# Patient Record
Sex: Male | Born: 1945 | Race: White | Hispanic: No | Smoking: Former smoker
Health system: Southern US, Community
[De-identification: ages and names within clinical notes are randomized; demographics above are authoritative.]

## PROBLEM LIST (undated history)

## (undated) DIAGNOSIS — M1990 Unspecified osteoarthritis, unspecified site: Secondary | ICD-10-CM

## (undated) DIAGNOSIS — I10 Essential (primary) hypertension: Secondary | ICD-10-CM

## (undated) DIAGNOSIS — Z85828 Personal history of other malignant neoplasm of skin: Secondary | ICD-10-CM

## (undated) DIAGNOSIS — Z77098 Contact with and (suspected) exposure to other hazardous, chiefly nonmedicinal, chemicals: Secondary | ICD-10-CM

## (undated) DIAGNOSIS — E782 Mixed hyperlipidemia: Secondary | ICD-10-CM

## (undated) DIAGNOSIS — Z86010 Personal history of colonic polyps: Secondary | ICD-10-CM

## (undated) DIAGNOSIS — H9319 Tinnitus, unspecified ear: Secondary | ICD-10-CM

## (undated) DIAGNOSIS — Z9889 Other specified postprocedural states: Secondary | ICD-10-CM

## (undated) DIAGNOSIS — K219 Gastro-esophageal reflux disease without esophagitis: Secondary | ICD-10-CM

## (undated) DIAGNOSIS — H9190 Unspecified hearing loss, unspecified ear: Secondary | ICD-10-CM

## (undated) DIAGNOSIS — R011 Cardiac murmur, unspecified: Secondary | ICD-10-CM

## (undated) DIAGNOSIS — E119 Type 2 diabetes mellitus without complications: Secondary | ICD-10-CM

## (undated) DIAGNOSIS — R351 Nocturia: Secondary | ICD-10-CM

## (undated) DIAGNOSIS — Z7739 Contact with and (suspected) exposure to other war theater: Secondary | ICD-10-CM

## (undated) DIAGNOSIS — C801 Malignant (primary) neoplasm, unspecified: Secondary | ICD-10-CM

## (undated) HISTORY — DX: Gastro-esophageal reflux disease without esophagitis: K21.9

## (undated) HISTORY — DX: Personal history of colonic polyps: Z86.010

## (undated) HISTORY — DX: Contact with and (suspected) exposure to other war theater: Z77.39

## (undated) HISTORY — PX: BILATERAL KNEE ARTHROSCOPY: SUR91

## (undated) HISTORY — PX: EYE SURGERY: SHX253

## (undated) HISTORY — DX: Essential (primary) hypertension: I10

## (undated) HISTORY — DX: Cardiac murmur, unspecified: R01.1

## (undated) HISTORY — DX: Other specified postprocedural states: Z98.890

## (undated) HISTORY — DX: Malignant (primary) neoplasm, unspecified: C80.1

## (undated) HISTORY — DX: Type 2 diabetes mellitus without complications: E11.9

## (undated) HISTORY — DX: Mixed hyperlipidemia: E78.2

## (undated) HISTORY — PX: OTHER SURGICAL HISTORY: SHX169

## (undated) HISTORY — DX: Contact with and (suspected) exposure to other hazardous, chiefly nonmedicinal, chemicals: Z77.098

## (undated) HISTORY — DX: Unspecified osteoarthritis, unspecified site: M19.90

---
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
## 1988-11-29 HISTORY — PX: ROTATOR CUFF REPAIR: SHX139

## 1998-12-15 ENCOUNTER — Ambulatory Visit (HOSPITAL_COMMUNITY): Admission: RE | Admit: 1998-12-15 | Discharge: 1998-12-15 | Payer: Self-pay | Admitting: Family Medicine

## 2001-02-21 ENCOUNTER — Ambulatory Visit (HOSPITAL_COMMUNITY): Admission: RE | Admit: 2001-02-21 | Discharge: 2001-02-21 | Payer: Self-pay | Admitting: *Deleted

## 2001-02-21 ENCOUNTER — Encounter: Payer: Self-pay | Admitting: *Deleted

## 2005-11-29 HISTORY — PX: TOTAL KNEE ARTHROPLASTY: SHX125

## 2005-12-10 ENCOUNTER — Ambulatory Visit: Payer: Self-pay | Admitting: Internal Medicine

## 2006-01-06 ENCOUNTER — Inpatient Hospital Stay (HOSPITAL_COMMUNITY): Admission: RE | Admit: 2006-01-06 | Discharge: 2006-01-10 | Payer: Self-pay | Admitting: Specialist

## 2009-03-20 ENCOUNTER — Ambulatory Visit: Payer: Self-pay | Admitting: Internal Medicine

## 2009-03-29 DIAGNOSIS — Z9889 Other specified postprocedural states: Secondary | ICD-10-CM

## 2009-03-29 HISTORY — DX: Other specified postprocedural states: Z98.890

## 2009-04-02 ENCOUNTER — Ambulatory Visit: Payer: Self-pay | Admitting: Internal Medicine

## 2009-04-02 ENCOUNTER — Encounter: Payer: Self-pay | Admitting: Internal Medicine

## 2009-04-02 DIAGNOSIS — Z860100 Personal history of colon polyps, unspecified: Secondary | ICD-10-CM

## 2009-04-02 HISTORY — PX: COLONOSCOPY: SHX174

## 2009-04-02 HISTORY — DX: Personal history of colon polyps, unspecified: Z86.0100

## 2009-04-02 HISTORY — DX: Personal history of colonic polyps: Z86.010

## 2009-04-15 ENCOUNTER — Encounter: Payer: Self-pay | Admitting: Internal Medicine

## 2011-01-15 ENCOUNTER — Encounter (HOSPITAL_COMMUNITY): Payer: Self-pay | Admitting: Radiology

## 2011-01-15 ENCOUNTER — Emergency Department (HOSPITAL_COMMUNITY)
Admission: EM | Admit: 2011-01-15 | Discharge: 2011-01-15 | Disposition: A | Discharge status: Home / Self Care | Payer: PRIVATE HEALTH INSURANCE | Attending: Emergency Medicine | Admitting: Emergency Medicine

## 2011-01-15 ENCOUNTER — Emergency Department (HOSPITAL_COMMUNITY): Payer: PRIVATE HEALTH INSURANCE

## 2011-01-15 DIAGNOSIS — R0789 Other chest pain: Secondary | ICD-10-CM | POA: Insufficient documentation

## 2011-01-15 DIAGNOSIS — I10 Essential (primary) hypertension: Secondary | ICD-10-CM | POA: Insufficient documentation

## 2011-01-15 DIAGNOSIS — E119 Type 2 diabetes mellitus without complications: Secondary | ICD-10-CM | POA: Insufficient documentation

## 2011-01-15 DIAGNOSIS — R0989 Other specified symptoms and signs involving the circulatory and respiratory systems: Secondary | ICD-10-CM | POA: Insufficient documentation

## 2011-01-15 DIAGNOSIS — R0609 Other forms of dyspnea: Secondary | ICD-10-CM | POA: Insufficient documentation

## 2011-01-15 DIAGNOSIS — E785 Hyperlipidemia, unspecified: Secondary | ICD-10-CM | POA: Insufficient documentation

## 2011-01-15 LAB — COMPREHENSIVE METABOLIC PANEL
ALT: 33 U/L (ref 0–53)
BUN: 17 mg/dL (ref 6–23)
CO2: 26 mEq/L (ref 19–32)
Calcium: 9.5 mg/dL (ref 8.4–10.5)
GFR calc non Af Amer: >60 mL/min (ref >60)
Glucose, Bld: 191 mg/dL — ABNORMAL HIGH (ref 70–99)
Sodium: 136 mEq/L (ref 135–145)

## 2011-01-15 LAB — CBC
HCT: 38 % — ABNORMAL LOW (ref 39.0–52.0)
Hemoglobin: 13 g/dL (ref 13.0–17.0)
MCH: 29 pg (ref 26.0–34.0)
MCHC: 34.2 g/dL (ref 30.0–36.0)
MCV: 84.6 fL (ref 78.0–100.0)
Platelets: 199 10*3/uL (ref 150–400)
RBC: 4.49 MIL/uL (ref 4.22–5.81)
RDW: 12.2 % (ref 11.5–15.5)
WBC: 4.9 10*3/uL (ref 4.0–10.5)

## 2011-01-15 LAB — CK TOTAL AND CKMB (NOT AT ARMC)
CK, MB: 1.4 ng/mL (ref 0.3–4.0)
Relative Index: INVALID (ref 0.0–2.5)
Total CK: 74 U/L (ref 7–232)

## 2011-01-15 LAB — LIPASE, BLOOD: Lipase: 27 U/L (ref 11–59)

## 2011-02-19 ENCOUNTER — Ambulatory Visit (INDEPENDENT_AMBULATORY_CARE_PROVIDER_SITE_OTHER): Payer: PRIVATE HEALTH INSURANCE | Admitting: Cardiology

## 2011-02-19 ENCOUNTER — Encounter: Payer: Self-pay | Admitting: Cardiology

## 2011-02-19 VITALS — BP 110/69 | HR 69 | Ht 69.0 in | Wt 222.0 lb

## 2011-02-19 DIAGNOSIS — R072 Precordial pain: Secondary | ICD-10-CM | POA: Insufficient documentation

## 2011-02-19 DIAGNOSIS — K219 Gastro-esophageal reflux disease without esophagitis: Secondary | ICD-10-CM

## 2011-02-19 DIAGNOSIS — I10 Essential (primary) hypertension: Secondary | ICD-10-CM

## 2011-02-19 DIAGNOSIS — R079 Chest pain, unspecified: Secondary | ICD-10-CM

## 2011-02-19 DIAGNOSIS — E119 Type 2 diabetes mellitus without complications: Secondary | ICD-10-CM

## 2011-02-19 DIAGNOSIS — E782 Mixed hyperlipidemia: Secondary | ICD-10-CM

## 2011-02-19 NOTE — Assessment & Plan Note (Signed)
Followed by Dr Fusco 

## 2011-02-19 NOTE — Progress Notes (Signed)
Clinical Summary Reginald Chambers is a 65 y.o.male presenting for cardiology consultation. He reports a history of intermittent chest discomfort, initially describing a "knot" sensation in his xiphoid area that occurred approximately 2 months ago, subsequently with an intermittent dull discomfort in his chest as well as in his left arm. He was treated with a proton pump inhibitor, that he thinks may have helped symptoms to some degree. He states that he is under a lot of stress with his job, has been trying to do some exercise by walking. He reports NYHA class II dyspnea on exertion, no palpitations or syncope.  Reginald Chambers states he underwent cardiac stress testing at least 5 years ago through our practice in [place], results presently not available for my review. He is not aware of any significant abnormalities at that time. He does have ongoing risk factors for coronary artery disease, outlined below, as well as some family history.   Allergies  Allergen Reactions  . Penicillins     Current outpatient prescriptions:aspirin 325 MG tablet, Take 325 mg by mouth daily.  , Disp: , Rfl: ;  celecoxib (CELEBREX) 200 MG capsule, Take 200 mg by mouth 2 (two) times daily.  , Disp: , Rfl: ;  glipiZIDE (GLUCOTROL) 5 MG tablet, Take 5 mg by mouth 2 (two) times daily before a meal.  , Disp: , Rfl: ;  metFORMIN (GLUMETZA) 1000 MG (MOD) 24 hr tablet, Take 1,000 mg by mouth 2 (two) times daily.  , Disp: , Rfl:  pantoprazole (PROTONIX) 40 MG tablet, Take 40 mg by mouth daily.  , Disp: , Rfl: ;  simvastatin (ZOCOR) 40 MG tablet, Take 40 mg by mouth at bedtime.  , Disp: , Rfl: ;  telmisartan-hydrochlorothiazide (MICARDIS HCT) 40-12.5 MG per tablet, Take 1 tablet by mouth daily.  , Disp: , Rfl:   Past Medical History  Diagnosis Date  . Type 2 diabetes mellitus   . Essential hypertension, benign   . Mixed hyperlipidemia   . Osteoarthritis   . Malaria     Past Surgical History  Procedure Date  . Total knee  arthroplasty 2007    Left knee  . Appendectomy 1959  . Rotator cuff repair 1990    Left shoulder    Family History  Problem Relation Age of Onset  . Lymphoma Father   . Heart failure Mother   . Diabetes type II Mother     Social History Reginald Chambers reports that he quit smoking today. His smoking use included Cigarettes. He has a 5 pack-year smoking history. He has never used smokeless tobacco. Reginald Chambers reports that he drinks alcohol.  Review of Systems No fevers, chills, or unusual weight change. No chest pain, progressive shortness of breath, cough, hemoptysis, or wheezing. No palpitations, dizziness, or syncope. No dysphasia or odynophagia. Stable appetite with no abdominal pain, melena, or hematochezia. No orthopnea, PND, or lower extremity edema. No focal motor weakness, memory problems, or speech deficits. Some arthritic discomfort, left shoulder and knee predominantly. Otherwise systems reviewed and negative except as already outlined.   Physical Examination Filed Vitals:   02/19/11 1326  BP: 110/69  Pulse: 69  Overweight, bearded male in no acute distress. HEENT: Conjunctiva and lids normal, oropharynx clear with moist mucosa. Neck: Supple, no elevated JVP or carotid bruits, no thyromegaly. Lungs: Clear to auscultation, nonlabored breathing at rest. Cardiac: Regular rate and rhythm, no S3 or significant systolic murmur, no pericardial rub. Abdomen: Soft, nontender, no hepatomegaly, bowel sounds present, no guarding or  rebound. Extremities: No pitting edema, distal pulses 2+. Skin: Warm and dry. Musculoskeletal: No kyphosis. Neuropsychiatric: Alert and oriented x3, affect grossly appropriate.   ECG Sinus rhythm at 69 beats per minute.   Problem List and Plan

## 2011-02-19 NOTE — Assessment & Plan Note (Signed)
Blood pressure is well-controlled today. 

## 2011-02-19 NOTE — Patient Instructions (Signed)
**Note De-Identified Ruger Saxer Obfuscation** We will contact you with results of test Your physician recommends that you schedule a follow-up appointment in: as needed Your physician has requested that you have en exercise stress myoview. For further information please visit [email or web address]. Please follow instruction sheet, as given.

## 2011-02-19 NOTE — Assessment & Plan Note (Signed)
Followed by Dr. Sherwood Gambler, on statin therapy.Would aim for aggressive LDL control around 70 in light of his diabetes mellitus.

## 2011-02-19 NOTE — Assessment & Plan Note (Signed)
It could be that this is also an explanation for some of his symptomatology. He did report improvement while taking Protonix. If his stress testing is reassuring, may need to followup with his primary care physician to consider antireflux measures long-term.

## 2011-02-19 NOTE — Assessment & Plan Note (Signed)
Recent chest pain, improved of late, however noted in the setting of several cardiac risk factors including diabetes mellitus, hypertension, hyperlipidemia, some family history, age, and gender. Resting ECG is nonspecific. He has not undergone any ischemic testing within the last 5 years. Plan will be to proceed with an exercise Myoview. We will call him with the results.

## 2011-02-23 ENCOUNTER — Other Ambulatory Visit: Payer: Self-pay | Admitting: Cardiology

## 2011-02-26 ENCOUNTER — Encounter (HOSPITAL_COMMUNITY): Payer: 59

## 2011-02-26 ENCOUNTER — Encounter (HOSPITAL_COMMUNITY): Payer: Self-pay

## 2011-02-26 ENCOUNTER — Encounter (HOSPITAL_COMMUNITY)
Admission: RE | Admit: 2011-02-26 | Discharge: 2011-02-26 | Disposition: A | Discharge status: Home / Self Care | Payer: 59 | Source: Ambulatory Visit | Attending: Cardiology | Admitting: Cardiology

## 2011-02-26 ENCOUNTER — Ambulatory Visit (INDEPENDENT_AMBULATORY_CARE_PROVIDER_SITE_OTHER): Payer: 59 | Admitting: *Deleted

## 2011-02-26 DIAGNOSIS — R079 Chest pain, unspecified: Secondary | ICD-10-CM | POA: Insufficient documentation

## 2011-02-26 DIAGNOSIS — R072 Precordial pain: Secondary | ICD-10-CM

## 2011-02-26 DIAGNOSIS — I10 Essential (primary) hypertension: Secondary | ICD-10-CM

## 2011-02-26 IMAGING — NM NM MYOCAR SINGLE W/SPECT W/WALL MOTION & EF
2 series · 12 of 12 positions shown · non-contrast
Comparison: none

nm myoview pharmacologic stress

Ordering Physician: CREO BONDO GLADIIMAR
Aujla Physician: [REDACTED]al Data: 64-year-old gentleman with chest pain and multiple
cardiovascular risk factors.
NUCLEAR MEDICINE ADENOSINE STRESS MYOVIEW STUDY WITH SPECT AND LEFT
VENTRIUCLAR EJECTION FRACTION
Radionuclide Data: One-day rest/stress protocol performed with
[DATE] mCi of Vc-77m Myoview.
Stress Data: Treadmill exercise performed to a workload of 9.3 mets
and a heart rate of 153 bpm, 97% of age predicted maximum.
Exercise discontinued due to fatigue; no chest discomfort reported.
Blood pressure increased from a resting value of 120/65 to 190/80
early in recovery, a normal response.  No arrhythmias noted.
EKG: Normal sinus rhythm with sinus arrhythmia; otherwise normal.
Stress EKG:  Insignificant upsloping ST-segment depression.
Scintigraphic Data: Acquisition notable for moderate diaphragmatic
attenuation.  The left ventricle was mildly dilated.  On
tomographic images reconstructed in standard planes, there was
uniform and normal uptake of tracer all myocardial segments.  The
rest images were unchanged.  The gated reconstruction demonstrated
a small area of mild hypokinesis in the distal inferoseptal wall
with preserved overall LV systolic function and normal systolic
accentuation of activity.  Estimated ejection fraction was 56%.

[Series 1: cr cardiac tc low dose · 6.41mm/px · 6 of 64 frames shown]
[frame 6/64]
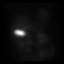
[frame 16/64]
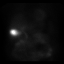
[frame 27/64]
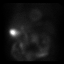
[frame 38/64]
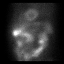
[frame 48/64]
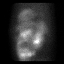
[frame 59/64]
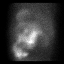

[Series 1: cs cardiac tc hi dose · 6.41mm/px · 6 of 512 frames shown]
[frame 43/512]
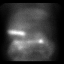
[frame 128/512]
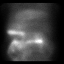
[frame 214/512]
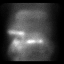
[frame 299/512]
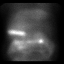
[frame 384/512]
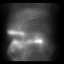
[frame 470/512]
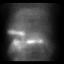

[12 of 12 positions shown; findings below may reference images not displayed]

IMPRESSION: Normal stress nuclear myocardial study revealing adequate exercise
tolerance, borderline left ventricular dilatation with preserved
systolic function and perhaps a small segmental wall motion
abnormality, a negative stress EKG and normal myocardial perfusion
without evidence for ischemia or infarction.  Other findings as
noted.

## 2011-02-26 MED ORDER — TECHNETIUM TC 99M TETROFOSMIN IV KIT
30.0000 | PACK | Freq: Once | INTRAVENOUS | Status: AC | PRN
  Administered 2011-02-26: 30 via INTRAVENOUS

## 2011-02-26 MED ORDER — TECHNETIUM TC 99M TETROFOSMIN IV KIT
10.0000 | PACK | Freq: Once | INTRAVENOUS | Status: AC | PRN
  Administered 2011-02-26: 9.3 via INTRAVENOUS

## 2011-02-26 NOTE — Progress Notes (Deleted)
Exercise Treadmill Test  Pre-Exercise Testing Evaluation Rhythm: normal sinus  Rate: 51   PR:  {CHL PR BASELINE EKG FOR ETT:21021049} QRS:  {CHL QRS BASELINE EKG FOR ETT:21021050}  QT:  {CHL QT BASELINE EKG FOR ETT:21021051} QTc: {CHL QTC BASELINE EKG FOR ETT:21021052}   P axis: {CHL AXIS BASELINE EKG FOR ETT:21021053}  QRS axis:  {CHL AXIS BASELINE EKG FOR ETT:21021053}  ST Segments:  {CHL ST SEGMENTS BASELINE EKG FOR ZOX:09604540}     Test  Exercise Tolerance Test Ordering MD: Liane Comber  Interpreting MD:  Dr. [place] Bing  Unique Test No:  Treadmill:  {CHL TREADMILL # FOR JWJ:19147829}  Indication for ETT: {CHL INDICATION FOR FAO:13086578}  Contraindication to ETT: {CHL CONTRAINDICATION TO ION:62952841}   Stress Modality: {CHL STRESS MODALITY FOR LKG:40102725}  Cardiac Imaging Performed: {CHL CARDIAC IMAGING PERFORMED FOR DGU:44034742}   Protocol: standard Bruce - maximal  Max BP:  192/78  Max MPHR (bpm):  157 85% MPR (bpm): 133  MPHR obtained (bpm): 153 % MPHR obtained:  97%  Reached 85% MPHR (min:sec):  6:34 Total Exercise Time (min-sec):  7:34  Workload in METS: 9.30 Borg Scale:   Reason ETT Terminated:  fatigue    ST Segment Analysis At Rest: {CHL ST SEGMENT AT REST FOR VZD:63875643} With Exercise: {CHL ST SEGMENT WITH EXERCISE FOR PIR:51884166}  Other Information Arrhythmia:  {CHL ARRHYTHMIA FOR AYT:01601093} Angina during ETT:  {CHL ANGINA DURING ATF:57322025} Quality of ETT:  {CHL QUALITY OF KYH:06237628}  ETT Interpretation:  {CHL INTERPRETATION FOR BTD:17616073}  Comments:  Recommendations:

## 2011-03-09 LAB — GLUCOSE, CAPILLARY: Glucose-Capillary: 103 mg/dL — ABNORMAL HIGH (ref 70–99)

## 2011-03-20 NOTE — Progress Notes (Signed)
See Dictated Nuclear Stress Test.

## 2011-04-16 NOTE — H&P (Signed)
NAMETESLA, BOCHICCHIO              ACCOUNT NO.:  [id number]   MEDICAL RECORD NO.:  [id number]          PATIENT TYPE:   LOCATION:                                 FACILITY:   PHYSICIAN:  Erasmo Leventhal, M.D.DATE OF BIRTH:  06-20-1946   DATE OF ADMISSION:  01/06/2006  DATE OF DISCHARGE:                                HISTORY & PHYSICAL   CHIEF COMPLAINT:  Left knee arthritis.   HISTORY OF PRESENT ILLNESS:  This is a 65 year old gentleman with a history  of end-stage osteoarthritis of his left knee.  He has failed conservative  measures for alleviation of his pain and has pain with all activities and  also pain at night.  Due to his continued symptoms, we discussed total knee  arthroplasty.  We discussed the risks, benefits and aftercare in detail.  Questions were provided and answered.  He has had medical clearance from Dr.  Artis Flock and Dr. Riley Kill and surgery is to go ahead as scheduled.   ALLERGIES:  PENICILLIN which causes him to faint.   CURRENT MEDICATIONS:  1.  Metaglip 2.5/500 mg one b.i.d.  2.  Zocor 40 mg one daily.  3.  Micardis hydrochlorothiazide 40/12.5 one daily.  4.  Toprol-XL 25 mg one p.o. daily.   PAST MEDICAL HISTORY:  1.  Bilateral knee arthroscopies and shoulder surgery.  2.  He also has had shrapnel removed.  3.  Appendectomy.   SERIOUS MEDICAL ILLNESSES:  1.  Hypertension.  2.  History of atypical chest pain that has been worked up by his      cardiologist who felt he was as at low risk for surgery.   FAMILY HISTORY:  Positive for coronary artery disease, diabetes and  hypertension.   SOCIAL HISTORY:  The patient works on a daily basis. He is married. He does  not smoke and drinks two drinks per day.   REVIEW OF SYSTEMS:  Negative for headache, blurred vision or dizziness.  PULMONARY:  Negative for shortness of breath, PND or orthopnea.  CARDIOVASCULAR:  Positive for  history of hypertension and atypical chest  pain.  GI:  Positive for  history of hemorrhoids.  Negative for ulcers or  hepatitis.  He did have a history of malaria in [place].  GU: Negative for  urinary tract difficulty.  MUSCULOSKELETAL: Positive in HPI.   PHYSICAL EXAMINATION:  VITAL SIGNS: BP 120/80, pulse 60 and regular,  respirations 16.  GENERAL:  Well-developed, well-nourished gentleman in no acute distress.  HEENT:  Normocephalic, atraumatic.  Nose patent. Ears patent.  Pupils are  equal, round and reactive to light.  Throat without injection.  NECK:  Supple without adenopathy.  Carotids 2+ without bruits.  CHEST:  Clear to auscultation.  No rales or rhonchi.  Respirations 16.  HEART:  Regular rate and rhythm at 60 beats per minute without murmur.  ABDOMEN:  Soft with active bowel sounds.  No masses or organomegaly.  NEUROLOGIC:  Alert and oriented to time, place and person.  Cranial nerves  II-XII grossly intact.  EXTREMITIES:  Left knee with 0-135 degrees range of motion.  Slight  varus  deformity.  No flexion contracture. Neurovascular status intact.  Dorsalis  pedis and posterior tibialis pulses 2+.   X-ray show end-stage osteoarthritis of the left knee.   IMPRESSION:  End-stage osteoarthritis of the left knee.   PLAN:  Total knee arthroplasty, left knee.      Jaquelyn Bitter. Chabon, P.A.    ______________________________  Erasmo Leventhal, M.D.    SJC/MEDQ  D:  12/23/2005  T:  12/23/2005  Job:  [id number]

## 2011-04-16 NOTE — Consult Note (Signed)
NAMEBRENTT, FREAD              ACCOUNT NO.:  [id number]   MEDICAL RECORD NO.:  [id number]          PATIENT TYPE:  INP   LOCATION:  1914                         FACILITY:  [facility]   PHYSICIAN:  Hillery Aldo, M.D.   DATE OF BIRTH:  1946-02-26   DATE OF CONSULTATION:  01/06/2006  DATE OF DISCHARGE:                                   CONSULTATION   PRIMARY CARE PHYSICIAN:  Quita Skye. Artis Flock, M.D.   REASON FOR CONSULTATION:  Postoperative management of diabetes and  hypertension.   HISTORY OF PRESENT ILLNESS:  Patient is a 65 year old male with a past  medical history of hypertension and diabetes who was admitted to the  hospital today for an elective left total knee replacement by Dr. Thomasena Edis.  We are asked to help manage his postoperative course.  The patient is  currently asymptomatic, and his blood pressure and glycemic control are  good.   PAST MEDICAL HISTORY:  1.  Diabetes mellitus, diagnosed at age 89.  Good control.  2.  Hypertension.  3.  Obesity.  4.  Osteoarthritis.  5.  History of appendectomy in 1959.  6.  History of rotator cuff repair of the left shoulder in 1990.  7.  History of atypical chest pain with a negative stress test.  8.  History of malaria.  9.  Dyslipidemia.   CURRENT MEDICATIONS:  1.  Zocor 40 mg daily.  2.  Metaglip 2.5/500 1 tab b.i.d.  3.  Aspirin 325 mg daily.  4.  Micardis/HCTZ 40/12.5 1 tab daily.   ALLERGIES:  PENICILLIN causes him to pass out.   SOCIAL HISTORY:  The patient is married.  He has a remote history of tobacco  use.  He drinks alcohol in social circumstances.  He works as a Psychologist, counselling.  He has three off-spring in good health.   FAMILY HISTORY:  The patient's father died of lymphoma at age 36.  The  patient's mother died at age 79 of congestive heart failure and  complications of diabetes.  He has five siblings who are all in good health.   REVIEW OF SYSTEMS:  No fever or chills.  No weight loss.  No chest  pain,  shortness of breath, or orthopnea.  He has an occasional cough.  No change  in his bowel habits.  No melena or hematochezia.  No dysuria.  No focal  neurologic deficits.  No headaches.   PHYSICAL EXAMINATION:  VITAL SIGNS:  Temperature 97.4, blood pressure  106/51, pulse 69, respirations 14, O2 saturation 98% on 2 liters.  GENERAL:  An obese white male in no acute distress.  HEENT:  Normocephalic and atraumatic.  PERRL.  EOMI.  Oropharynx is clear.  NECK:  Supple.  No thyromegaly.  No lymphadenopathy.  No jugular venous  distention.  CHEST:  Lungs are clear to auscultation bilaterally with good air movement.  HEART:  Regular rate and rhythm.  No murmurs, rubs or gallops.  ABDOMEN:  Soft, nontender, nondistended with normoactive bowel sounds.  EXTREMITIES:  No appreciable edema.  He has TED hose on.   LABORATORY:  Data  revealed a hemoglobin of 14, hematocrit 40.5, white blood  cell count 6.3, and platelets 197.  Sodium was 137, potassium 4.8, chloride  101, bicarb 27, BUN 24, creatinine 1.1, glucose 187.  Liver function studies  were all within normal limits.   ASSESSMENT/PLAN:  1.  Hypertension:  The patient is currently normotensive.  Would resume his      antihypertensive medications when he begins to eat.  2.  Diabetes:  Check the patient's hemoglobin A1C to determine his overall      glycemic control and continue sliding-scale insulin perioperatively.      Once he starts taking p.o., restart his oral hypoglycemics.  3.  Dyslipidemia:  Will start the statin as soon as he is taking p.o.   Thank you for this consultation.  Will follow the patient with you.           ______________________________  Hillery Aldo, M.D.     CR/MEDQ  D:  01/06/2006  T:  01/06/2006  Job:  [id number]   cc:   Quita Skye. Artis Flock, M.D.  Fax: [phone]

## 2011-04-16 NOTE — Op Note (Signed)
Reginald Chambers, Reginald Chambers              ACCOUNT NO.:  [id number]   MEDICAL RECORD NO.:  [id number]          PATIENT TYPE:  INP   LOCATION:  0009                         FACILITY:  [facility]   PHYSICIAN:  Reginald Chambers, M.D.DATE OF BIRTH:  09/06/1946   DATE OF PROCEDURE:  01/06/2006  DATE OF DISCHARGE:                                 OPERATIVE REPORT   PREOPERATIVE DIAGNOSIS:  Left knee end-stage osteoarthritis.   POSTOPERATIVE DIAGNOSIS:  Left knee end-stage osteoarthritis.   PROCEDURE:  Left total knee arthroplasty.   SURGEON:  Reginald Chambers, M.D.   ASSISTANT:  Reginald Bitter. Chabon, PA-C.   ANESTHESIA:  Spinal.   ESTIMATED BLOOD LOSS:  Less than 50 cc.   DRAINS:  Two medium Hemovac.   COMPLICATIONS:  None.   TOURNIQUET TIME:  One hour, 50 minutes at 350 mmHg.   DISPOSITION:  PACU stable.   OPERATIVE IMPLANTS:  Reginald Chambers & [employer] rotating platform.  Size 4  femur.  Size 4 tibia.  A 10 mm rotating platform tibial insert and a 38 mm  polyethylene patella, all cemented.   OPERATIVE DETAILS:  The patient was counseled in the holding area.  The  correct side was identified and marked appropriately.  The chart reviewed  and signed.  The IV was started, antibiotics were given.  Taken to the OR.  Spinal anesthetic was administered.  Foley catheter was placed, utilizing a  sterile technique by the OR circulating nurse.  All extremities were well  padded and bumped.  The left knee was examined with 5 degree flexion  contractures, flexed to 130 degrees, elevated.  Prepped with DuraPrep and  draped in a sterile fashion.  Exsanguinated with esmarch.  Tourniquet was  inflated to 350 mmHg.  A straight midline incision was made through the skin  and subcutaneous tissue.  Medial and lateral soft tissue flaps were  developed.  Medial parapatellar arthrotomy was performed.  The patella was  retracted or everted, bended in place for the necessary exposure.  A  proximal medial  soft tissue release was done, secondary to being a varus  knee.  The cruciate ligaments were resected.  End-stage arthritis was  present.  A starting hole was made ___________ the distal femur.  The canal  was irrigated.  Effluent was clear.  ___________ were gently placed.  I  chose a 5 degree valgus cut and chose an 11 mm cut off the distal femur.  This was done.  The femur was initially cut to fit a size 5.  Rotation marks  were made, and the distal femoral cutting block was applied.  Medial and  lateral menisci were removed under direct visualization.  Geniculate vessels  were coagulated.  Posterior neurovascular structures were __________ off and  protected throughout the entire case.  The tibial eminence was resected.  The proximal tibia was found to be between a size 4 and a 5, central aspect  was noted.  A step reamer was utilized.  The canal was irrigated.  The  effluent was clear.  The extramedullary rod was gently placed.  Next, we  took  different cuts off the proximal tibia, eventually ended up taking a 10  mm cut based upon the lateral side, giving [place] 4 off the defect on the medial  side.  With flexion and extension blocks at this point in time, we were okay  in extension but tight in flexion.  Based upon that, went back and re-  evaluated the distal femur, and then downsized to a size 4.  Also the tibia  was remeasured and found to be a size 4.  At this time, with spacer blocks  of 10 mm, we had good flexion and extension gaps.  Posterior medial and  posterior femoral osteophytes were removed under direct visualization.  A  size 4 base plate was applied to the tibia, giving excellent coverage and  alignment.  Locking holes were then placed, a step drill and punch.  At this  time, the distal femoral cutting block was applied.  The box cut was then  made.  At this time, a size 4 femur and a size 4 tibia, a 10 insert with  excellent range of motion.  Flexion and extension gaps  were well balanced,  also stable to varus and valgus stress.  The patella is found to be a size  38.  The appropriate amount of bone was resected.  Locking holes were made  with a 38 mm patella button.  We had excellent patellofemoral tracking, as  we were well-balanced at flexion, extension, and varus and valgus.  The  trials were then removed.  The knee was irrigated with pulsatile lavage  while cement was mixed.  Utilizing modern cement technique, all components  were cemented into place, a size 4 femur, a size 4 tibia, a 10 mm trial  insert with a 38 patella.  The cement has dried.  Excess cement was removed.  Bone wax placed to the exposed bony surfaces.  We trialed a 12.5, but it was  too tight; therefore, we removed the 10 trial and put in a final 10 mm  rotating platform posterior stabilized tibial insert.  We now checked the  knee.  We had anatomic patellofemoral tracking, stable to varus and valgus  stress, and well balanced flexion and extension gaps.  The wounds were  copiously irrigated, and we also irrigated the knee with pulsatile lavage  again.  Two medium Hemovac drains were placed.  The arthrotomy was closed  with 90 degrees of flexion, subcu Vicryl.  The skin was closed with subcu  Monocryl suture.  Steri-Strips were applied.  Sterile compressive dressing.  Two medium Hemovac drains that had been placed to the knee joint were now  hooked to suction.  The tourniquet was deflated.  Normal circulation of the  foot and ankle at the end of the case.  He had been given vancomycin  preoperatively.  No more antibiotics were required at this time.  The  patient had excellent circulation of the foot and ankle at the end of the  case.  No complications or problems.  Sponge and needle counts were correct.  He was then gently awakened.  He was then gently taken from the operating  room to the PACU in stable condition.  To help with surgical retraction, decision making throughout the  entire  case, Mr. Reginald Able, PA-C's, assistance was needed.           ______________________________  Reginald Chambers, M.D.     RAC/MEDQ  D:  01/06/2006  T:  01/06/2006  Job:  [id number] 

## 2011-04-16 NOTE — Discharge Summary (Signed)
Reginald Chambers, Reginald Chambers              ACCOUNT NO.:  [id number]   MEDICAL RECORD NO.:  [id number]          PATIENT TYPE:  INP   LOCATION:  1515                         FACILITY:  [facility]   PHYSICIAN:  Erasmo Leventhal, M.D.DATE OF BIRTH:  1946-06-20   DATE OF ADMISSION:  01/06/2006  DATE OF DISCHARGE:  01/10/2006                                 DISCHARGE SUMMARY   ADMISSION DIAGNOSES:  End-stage osteoarthritis, left knee.   DISCHARGE DIAGNOSIS:  End-stage osteoarthritis, left knee.   OPERATION:  Total knee arthroplasty, left knee.   BRIEF HISTORY:  This 65 year old gentleman with a history of end-stage  osteoarthritis of the left knee, failed conservative measures.  To alleviate  his pain and after discussing the treatment, risks, benefits and options,  the patient is now scheduled for total knee arthroplasty.  Surgery, risks,  benefits and aftercare were discussed in detail with the patient.  Questions  invited and answered.   LABORATORY VALUES:  Admission CBC within normal limits.  Hemoglobin and  hematocrit reached a low of 11 and 31.8 on the 11th.  Admission PT/PTT  within normal limits.  INR 1.9 at discharge.  Admission BMET showed glucose  high at 187, BUN high at 24.  Ran a mildly elevated glucose throughout  admission.  Had some hyponatremia, which was corrected by discharge.  Also,  one bout of hypokalemia on the 7th, which was corrected.  Glycosylated  hemoglobin was noted to be 7.9.  Urine culture showed no growth.   HOSPITAL COURSE:  The patient tolerated the operative procedure well.  Medical consult was obtained with [facility] for management of  diabetes and hypertension in the hospital.  On the first postoperative day,  the patient was feeling good.  Had some mild itching.  Vital signs are  stable.  He was afebrile.  Temp 99.5 max.  O2 sats 98%.  Hemoglobin and  hematocrit were stable.  Calves were negative.  Drain was removed without  difficulty.  We  switched him to a PCA Dilaudid in case his itching was  coming from the morphine.  IV fluids were switched to D5 and normal saline  at 50 an hour.   On the second postoperative day, vital signs were stable.  The patient was  afebrile.  Neurovascular status was intact.  The dressing was changed.  The  wound was benign.  Calves were negative.  PCA was DC'd.   On the third postoperative day, he had a temp to 101 last night but was  afebrile in the morning.  Minimal redness around the wound.  Hemoglobin and  hematocrit were stable.  Physical therapy continued.  His elevated blood  glucose was taken care of by [facility].   On the fourth postoperative day, he was feeling okay.  Vital signs stable.  Afebrile.  Lungs clear.  Heart sounds normal.  Bowel sounds active.  Calves  negative.  Dressing was changed and wound benign.  He had a low potassium  and a low sodium.  His labs were repeated and found to be within normal  limits.  The patient was subsequently  discharged home for followup in the  office.   CONDITION ON DISCHARGE:  Improved.   DISCHARGE MEDICATIONS:  1.  Percocet 1-2 q.4-6h. p.r.n. pain.  2.  Robaxin 500 mg 1 p.o. q.8h. p.r.n. spasm.  3.  Coumadin per pharmacy protocol.  4.  Trinsicon 1 pill a day for anemia.  5.  Continue his home medications but hold his Micardis for one week, then      restart, and then increase the Metaglip to 3 times per day.   Call Dr. Artis Flock for followup postoperatively.  Follow up with Dr. Thomasena Edis in  10 days.   DISCHARGE INSTRUCTIONS:  He is to do his home exercises and call if problems  or questions arise.      Jaquelyn Bitter. Chabon, P.A.    ______________________________  Erasmo Leventhal, M.D.    SJC/MEDQ  D:  01/19/2006  T:  01/20/2006  Job:  [id number]   cc:   [facility]   Fayrene Fearing D. Artis Flock, M.D.  Fax: [phone]

## 2011-11-17 ENCOUNTER — Ambulatory Visit: Payer: Managed Care, Other (non HMO) | Admitting: Urgent Care

## 2011-11-18 ENCOUNTER — Encounter: Payer: Self-pay | Admitting: Internal Medicine

## 2011-11-25 ENCOUNTER — Encounter: Payer: Self-pay | Admitting: Gastroenterology

## 2011-11-25 ENCOUNTER — Ambulatory Visit (INDEPENDENT_AMBULATORY_CARE_PROVIDER_SITE_OTHER): Payer: 59 | Admitting: Gastroenterology

## 2011-11-25 VITALS — BP 124/72 | HR 65 | Temp 98.2°F | Ht 69.0 in | Wt 221.6 lb

## 2011-11-25 DIAGNOSIS — R197 Diarrhea, unspecified: Secondary | ICD-10-CM

## 2011-11-25 NOTE — Progress Notes (Signed)
Referring Provider: Cassell Smiles., MD Primary Care Physician:  Cassell Smiles., MD, MD Primary Gastroenterologist: Dr. Jena Gauss   Chief Complaint  Patient presents with  . Colonoscopy  . Diarrhea    HPI:   Reginald Chambers is a 65 year old male who presents at the request of Dr. Sherwood Gambler for a possible colonoscopy. His last colonoscopy was in 2010 with an adenoma as outlined below. He is due for surveillance in 2015. He reports diarrhea from April to Thanksgiving. States his Glipizide had been increased in April but then reduced again at Thanksgiving. After returning to his baseline medications, the diarrhea ceased. Notes this was usually postprandial, at most twice per day. No hematochezia or melena noted.   Denies abdominal pain, N/V. Has been watching what he eats for the last few years. 4 years ago weighed 240, now 221.     Past Medical History  Diagnosis Date  . Type 2 diabetes mellitus   . Essential hypertension, benign   . Mixed hyperlipidemia   . Osteoarthritis   . Malaria   . History of agent Orange exposure     [place]  . S/P colonoscopy May 2010    7mm sessile polyp, adenoma in ascending colon, mild diverticulosis in sigmoid, internal hemorrhoids, needs repeat in 2015. Performed by Dr. Leone Payor    Past Surgical History  Procedure Date  . Total knee arthroplasty 2007    Left knee  . Appendectomy 1959  . Rotator cuff repair 1990    Left shoulder  . Colonoscopy 04/02/09    7 mm sessile polyp ascending colon/internal hemmorrhoids/mild diverticulosis sigmoid colon  . Bilateral knee arthroscopy   . Right shoulder arthroscopy     Current Outpatient Prescriptions  Medication Sig Dispense Refill  . aspirin 325 MG tablet Take 325 mg by mouth daily.        . celecoxib (CELEBREX) 200 MG capsule Take 200 mg by mouth 2 (two) times daily.        [name] glipiZIDE (GLUCOTROL) 5 MG tablet Take 5 mg by mouth 2 (two) times daily before a meal.        . metFORMIN (GLUMETZA) 1000 MG (MOD) 24  hr tablet Take 1,000 mg by mouth 2 (two) times daily.        . simvastatin (ZOCOR) 40 MG tablet Take 40 mg by mouth at bedtime.       [name] telmisartan-hydrochlorothiazide (MICARDIS HCT) 40-12.5 MG per tablet Take 1 tablet by mouth daily.       . pantoprazole (PROTONIX) 40 MG tablet Take 40 mg by mouth daily.          Allergies as of 11/25/2011 - Review Complete 11/25/2011  Allergen Reaction Noted  . Penicillins  01/15/2011    Family History  Problem Relation Age of Onset  . Lymphoma Father   . Heart failure Mother   . Diabetes type II Mother   . Colon cancer Neg Hx     History   Social History  . Marital Status: Married    Spouse Name: N/A    Number of Children: N/A  . Years of Education: N/A   Social History Main Topics  . Smoking status: Former Smoker -- 1.0 packs/day for 5 years    Types: Cigarettes    Quit date: 02/19/1991  . Smokeless tobacco: Never Used  . Alcohol Use: Yes     Occasional  . Drug Use: No  . Sexually Active: None   Other Topics Concern  . None   Social  History Narrative  . None    Review of Systems: Gen: Denies fever, chills, anorexia. Denies fatigue, weakness, weight loss.  CV: Denies chest pain, palpitations, syncope, peripheral edema, and claudication. Resp: Denies dyspnea at rest, cough, wheezing, coughing up blood, and pleurisy. GI: Denies vomiting blood, jaundice, and fecal incontinence.   Denies dysphagia or odynophagia. Derm: Denies rash, itching, dry skin Psych: Denies depression, anxiety, memory loss, confusion. No homicidal or suicidal ideation.  Heme: Denies bruising, bleeding, and enlarged lymph nodes.  Physical Exam: BP 124/72  Pulse 65  Temp(Src) 98.2 F (36.8 C) (Temporal)  Ht 5\' 9"  (1.753 m)  Wt 221 lb 9.6 oz (100.517 kg)  BMI 32.72 kg/m2 General:   Alert and oriented. No distress noted. Pleasant and cooperative.  Head:  Normocephalic and atraumatic. Eyes:  Conjuctiva clear without scleral icterus. Mouth:  Oral mucosa  pink and moist. Good dentition. No lesions. Neck:  Supple, without mass or thyromegaly. Heart:  S1, S2 present without murmurs, rubs, or gallops. Regular rate and rhythm. Abdomen:  +BS, soft, non-tender and non-distended. No rebound or guarding. No HSM or masses noted. Msk:  Symmetrical without gross deformities. Normal posture. Extremities:  Without edema. Neurologic:  Alert and  oriented x4;  grossly normal neurologically. Skin:  Intact without significant lesions or rashes. Cervical Nodes:  No significant cervical adenopathy. Psych:  Alert and cooperative. Normal mood and affect.

## 2011-11-25 NOTE — Patient Instructions (Signed)
Please contact us if diarrhea returns or you have any other symptoms such as rectal bleeding, abdominal pain, other changes in bowel habits.  We are requesting colonoscopy reports. We will be in touch shortly regarding the need for an updated colonoscopy.   Have a Happy New Year!

## 2011-11-26 ENCOUNTER — Encounter: Payer: Self-pay | Admitting: Gastroenterology

## 2011-11-26 DIAGNOSIS — R197 Diarrhea, unspecified: Secondary | ICD-10-CM | POA: Insufficient documentation

## 2011-11-26 NOTE — Assessment & Plan Note (Signed)
65 year old male with prolonged diarrhea lasting from April to Thanksgiving, back to baseline after reducing Glipizide to original dosing, which was increased in April. No melena or hematochezia noted. Likely medication effect. Last colonoscopy in 2010 with adenoma, needs surveillance in 2015. No need for further investigation unless diarrhea recurs. In interim, will retrieve recent labs from Dr. Sherwood Gambler.   Follow-up prn Needs TCS in 2015.

## 2011-11-29 NOTE — Progress Notes (Signed)
Cc to PCP 

## 2011-12-01 NOTE — Progress Notes (Signed)
Reminder in epic to have tcs in 2015

## 2013-11-09 ENCOUNTER — Encounter: Payer: Self-pay | Admitting: Internal Medicine

## 2014-04-05 ENCOUNTER — Encounter: Payer: Self-pay | Admitting: Internal Medicine

## 2014-04-30 ENCOUNTER — Encounter: Payer: Self-pay | Admitting: Internal Medicine

## 2014-07-02 ENCOUNTER — Ambulatory Visit (AMBULATORY_SURGERY_CENTER): Payer: Self-pay | Admitting: *Deleted

## 2014-07-02 VITALS — Ht 70.0 in | Wt 209.2 lb

## 2014-07-02 DIAGNOSIS — Z860100 Personal history of colon polyps, unspecified: Secondary | ICD-10-CM

## 2014-07-02 MED ORDER — NA SULFATE-K SULFATE-MG SULF 17.5-3.13-1.6 GM/177ML PO SOLN: ORAL | Status: DC

## 2014-07-02 NOTE — Progress Notes (Signed)
No egg or soy allergy  No anesthesia or intubation problems per pt  No diet medications taken   Pt states he has metal in his body- left knee replacement

## 2014-07-16 ENCOUNTER — Encounter: Payer: Self-pay | Admitting: Internal Medicine

## 2014-07-16 ENCOUNTER — Ambulatory Visit (AMBULATORY_SURGERY_CENTER): Payer: 59 | Admitting: Internal Medicine

## 2014-07-16 VITALS — BP 111/71 | HR 58 | Temp 97.5°F | Resp 14 | Ht 70.0 in | Wt 209.0 lb

## 2014-07-16 DIAGNOSIS — Z860100 Personal history of colon polyps, unspecified: Secondary | ICD-10-CM

## 2014-07-16 MED ORDER — SODIUM CHLORIDE 0.9 % IV SOLN: 500.0000 mL | INTRAVENOUS | Status: DC

## 2014-07-16 NOTE — Op Note (Signed)
[facility]  [address] [place], [address]   COLONOSCOPY PROCEDURE REPORT  PATIENT: Reginald Chambers, Reginald Chambers  MR#: [id number] BIRTHDATE: 28-Sep-1946 , 73  yrs. old GENDER: Male ENDOSCOPIST: Gatha Mayer, MD, [facility] PROCEDURE DATE:  07/16/2014 PROCEDURE:   Colonoscopy, surveillance First Screening Colonoscopy - Avg.  risk and is 50 yrs.  old or older - No.  Prior Negative Screening - Now for repeat screening. N/A  History of Adenoma - Now for follow-up colonoscopy & has been > or = to 3 yrs.  Yes hx of adenoma.  Has been 3 or more years since last colonoscopy.  Polyps Removed Today? No.  Recommend repeat exam, <10 yrs? No. ASA CLASS:   Class II INDICATIONS:Patient's personal history of adenomatous colon polyps.  MEDICATIONS: propofol (Diprivan) 200mg  IV, MAC sedation, administered by CRNA, and These medications were titrated to patient response per physician's verbal order  DESCRIPTION OF PROCEDURE:   After the risks benefits and alternatives of the procedure were thoroughly explained, informed consent was obtained.  A digital rectal exam revealed no abnormalities of the rectum, A digital rectal exam revealed no prostatic nodules, and A digital rectal exam revealed the prostate was not enlarged.   The LB [id number]  endoscope was introduced through the anus and advanced to the cecum, which was identified by both the appendix and ileocecal valve. No adverse events experienced.   The quality of the prep was excellent using Suprep  The instrument was then slowly withdrawn as the colon was fully examined.      COLON FINDINGS: A normal appearing cecum, ileocecal valve, and appendiceal orifice were identified.  The ascending, hepatic flexure, transverse, splenic flexure, descending, sigmoid colon and rectum appeared unremarkable.  No polyps or cancers were seen.   A right colon retroflexion was performed.  Retroflexed views revealed no abnormalities. The time  to cecum=2 minutes 25 seconds. Withdrawal time=8 minutes 30 seconds.  The scope was withdrawn and the procedure completed. COMPLICATIONS: There were no complications.  ENDOSCOPIC IMPRESSION: Normal colonoscopy- excellent prep - hx 7 mm adenoma in 2010  RECOMMENDATIONS: Repeat colonoscopy 10 years.   eSigned:  Gatha Mayer, MD, [facility] 07/16/2014 10:54 AM   cc: [employer], MD and The Patient

## 2014-07-16 NOTE — Progress Notes (Signed)
A/ox3, pleased with MAC, report to RN 

## 2014-07-16 NOTE — Patient Instructions (Addendum)
No polyps today!  Next routine colonoscopy in 10 years - 2025  I appreciate the opportunity to care for you. Gatha Mayer, MD, FACG   YOU HAD AN ENDOSCOPIC PROCEDURE TODAY AT [facility]: Refer to the procedure report that was given to you for any specific questions about what was found during the examination.  If the procedure report does not answer your questions, please call your gastroenterologist to clarify.  If you requested that your care partner not be given the details of your procedure findings, then the procedure report has been included in a sealed envelope for you to review at your convenience later.  YOU SHOULD EXPECT: Some feelings of bloating in the abdomen. Passage of more gas than usual.  Walking can help get rid of the air that was put into your GI tract during the procedure and reduce the bloating. If you had a lower endoscopy (such as a colonoscopy or flexible sigmoidoscopy) you may notice spotting of blood in your stool or on the toilet paper. If you underwent a bowel prep for your procedure, then you may not have a normal bowel movement for a few days.  DIET: Your first meal following the procedure should be a light meal and then it is ok to progress to your normal diet.  A half-sandwich or bowl of soup is an example of a good first meal.  Heavy or fried foods are harder to digest and may make you feel nauseous or bloated.  Likewise meals heavy in dairy and vegetables can cause extra gas to form and this can also increase the bloating.  Drink plenty of fluids but you should avoid alcoholic beverages for 24 hours.  ACTIVITY: Your care partner should take you home directly after the procedure.  You should plan to take it easy, moving slowly for the rest of the day.  You can resume normal activity the day after the procedure however you should NOT DRIVE or use heavy machinery for 24 hours (because of the sedation medicines used during the test).    SYMPTOMS TO  REPORT IMMEDIATELY: A gastroenterologist can be reached at any hour.  During normal business hours, 8:30 AM to 5:00 PM Monday through Friday, call [phone].  After hours and on weekends, please call the GI answering service at [phone] who will take a message and have the physician on call contact you.   Following lower endoscopy (colonoscopy or flexible sigmoidoscopy):  Excessive amounts of blood in the stool  Significant tenderness or worsening of abdominal pains  Swelling of the abdomen that is new, acute  Fever of 100F or higher  Following upper endoscopy (EGD)  Vomiting of blood or coffee ground material  New chest pain or pain under the shoulder blades  Painful or persistently difficult swallowing  New shortness of breath  Fever of 100F or higher  Black, tarry-looking stools  FOLLOW UP: If any biopsies were taken you will be contacted by phone or by letter within the next 1-3 weeks.  Call your gastroenterologist if you have not heard about the biopsies in 3 weeks.  Our staff will call the home number listed on your records the next business day following your procedure to check on you and address any questions or concerns that you may have at that time regarding the information given to you following your procedure. This is a courtesy call and so if there is no answer at the home number and we have not heard  from you through the emergency physician on call, we will assume that you have returned to your regular daily activities without incident.  SIGNATURES/CONFIDENTIALITY: You and/or your care partner have signed paperwork which will be entered into your electronic medical record.  These signatures attest to the fact that that the information above on your After Visit Summary has been reviewed and is understood.  Full responsibility of the confidentiality of this discharge information lies with you and/or your care-partner.YOU HAD AN ENDOSCOPIC PROCEDURE TODAY AT [facility]: Refer to the procedure report that was given to you for any specific questions about what was found during the examination.  If the procedure report does not answer your questions, please call your gastroenterologist to clarify.  If you requested that your care partner not be given the details of your procedure findings, then the procedure report has been included in a sealed envelope for you to review at your convenience later.  YOU SHOULD EXPECT: Some feelings of bloating in the abdomen. Passage of more gas than usual.  Walking can help get rid of the air that was put into your GI tract during the procedure and reduce the bloating. If you had a lower endoscopy (such as a colonoscopy or flexible sigmoidoscopy) you may notice spotting of blood in your stool or on the toilet paper. If you underwent a bowel prep for your procedure, then you may not have a normal bowel movement for a few days.  DIET: Your first meal following the procedure should be a light meal and then it is ok to progress to your normal diet.  A half-sandwich or bowl of soup is an example of a good first meal.  Heavy or fried foods are harder to digest and may make you feel nauseous or bloated.  Likewise meals heavy in dairy and vegetables can cause extra gas to form and this can also increase the bloating.  Drink plenty of fluids but you should avoid alcoholic beverages for 24 hours.  ACTIVITY: Your care partner should take you home directly after the procedure.  You should plan to take it easy, moving slowly for the rest of the day.  You can resume normal activity the day after the procedure however you should NOT DRIVE or use heavy machinery for 24 hours (because of the sedation medicines used during the test).    SYMPTOMS TO REPORT IMMEDIATELY: A gastroenterologist can be reached at any hour.  During normal business hours, 8:30 AM to 5:00 PM Monday through Friday, call [phone].  After hours and on  weekends, please call the GI answering service at [phone] who will take a message and have the physician on call contact you.   Following lower endoscopy (colonoscopy or flexible sigmoidoscopy):  Excessive amounts of blood in the stool  Significant tenderness or worsening of abdominal pains  Swelling of the abdomen that is new, acute  Fever of 100F or higher    FOLLOW UP: If any biopsies were taken you will be contacted by phone or by letter within the next 1-3 weeks.  Call your gastroenterologist if you have not heard about the biopsies in 3 weeks.  Our staff will call the home number listed on your records the next business day following your procedure to check on you and address any questions or concerns that you may have at that time regarding the information given to you following your procedure. This is a courtesy call and so if there is no  answer at the home number and we have not heard from you through the emergency physician on call, we will assume that you have returned to your regular daily activities without incident.  SIGNATURES/CONFIDENTIALITY: You and/or your care partner have signed paperwork which will be entered into your electronic medical record.  These signatures attest to the fact that that the information above on your After Visit Summary has been reviewed and is understood.  Full responsibility of the confidentiality of this discharge information lies with you and/or your care-partner.

## 2014-07-17 ENCOUNTER — Telehealth: Payer: Self-pay | Admitting: *Deleted

## 2014-07-17 NOTE — Telephone Encounter (Signed)
  Follow up Call-  Call back number 07/16/2014  Post procedure Call Back phone  # [phone]  Permission to leave phone message Yes     Patient questions:  Do you have a fever, pain , or abdominal swelling? No. Pain Score  0 *  Have you tolerated food without any problems? Yes.    Have you been able to return to your normal activities? Yes.    Do you have any questions about your discharge instructions: Diet   No. Medications  No. Follow up visit  No.  Do you have questions or concerns about your Care? No.  Actions: * If pain score is 4 or above: No action needed, pain <4.

## 2014-11-18 ENCOUNTER — Other Ambulatory Visit: Payer: Self-pay | Admitting: Orthopedic Surgery

## 2014-12-02 NOTE — H&P (Signed)
TOTAL KNEE ADMISSION H&P  Patient is being admitted for right total knee arthroplasty.  Subjective:  Chief Complaint:right knee pain.  HPI: Reginald Chambers, 69 y.o. male, has a history of pain and functional disability in the right knee due to arthritis and has failed non-surgical conservative treatments for greater than 12 weeks to includeNSAID's and/or analgesics, corticosteriod injections, viscosupplementation injections, flexibility and strengthening excercises, supervised PT with diminished ADL's post treatment, weight reduction as appropriate and activity modification.  Onset of symptoms was gradual, starting 3 years ago with gradually worsening course since that time. The patient noted prior procedures on the knee to include  arthroscopy on the right knee(s).  Patient currently rates pain in the right knee(s) at 8 out of 10 with activity. Patient has night pain, worsening of pain with activity and weight bearing, pain that interferes with activities of daily living, pain with passive range of motion and joint swelling.  Patient has evidence of subchondral sclerosis, periarticular osteophytes and joint space narrowing by imaging studies. This patient has had Osteoarthritis. There is no active infection.  Patient Active Problem List   Diagnosis Date Noted  . Diarrhea 11/26/2011  . Chest pain, precordial 02/19/2011  . Essential hypertension, benign 02/19/2011  . Type 2 diabetes mellitus 02/19/2011  . Mixed hyperlipidemia 02/19/2011  . GERD (gastroesophageal reflux disease) 02/19/2011  . Personal history of colonic polyps - adenoma 04/02/2009   Past Medical History  Diagnosis Date  . Type 2 diabetes mellitus   . Essential hypertension, benign   . Mixed hyperlipidemia   . Osteoarthritis   . Malaria   . 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 S/P colonoscopy May 2010    61mm sessile polyp, adenoma in ascending colon, mild diverticulosis in sigmoid, internal hemorrhoids, needs  repeat in 2015. Performed by Dr. Carlean Purl  . Cancer     skin cancer- basal cell CA  . GERD (gastroesophageal reflux disease)   . Heart murmur     at birth  . Personal history of colonic polyps - adenoma 04/02/2009    Past Surgical History  Procedure Laterality Date  . Total knee arthroplasty  2007    Left knee  . Appendectomy  1959  . Rotator cuff repair  1990    Left shoulder  . Colonoscopy  04/02/09    7 mm sessile polyp ascending colon/internal hemmorrhoids/mild diverticulosis sigmoid colon  . Bilateral knee arthroscopy    . Right shoulder arthroscopy      No prescriptions prior to admission   Allergies  Allergen Reactions  . Penicillins     "passed out"    History  Substance Use Topics  . Smoking status: Former Smoker -- 1.00 packs/day for 5 years    Types: Cigarettes    Quit date: 02/19/1991  . Smokeless tobacco: Never Used  . Alcohol Use: Yes     Comment: Occasional    Family History  Problem Relation Age of Onset  . Lymphoma Father   . Heart failure Mother   . Diabetes type II Mother   . Colon cancer Neg Hx   . Esophageal cancer Neg Hx   . Stomach cancer Neg Hx   . Rectal cancer Neg Hx      Review of Systems  Constitutional: Negative.   HENT: Negative.   Eyes: Negative.   Respiratory: Negative.   Cardiovascular: Negative.   Gastrointestinal: Negative.   Genitourinary: Negative.   Musculoskeletal: Positive for joint pain.  Skin: Negative.  Neurological: Negative.   Endo/Heme/Allergies: Negative.   Psychiatric/Behavioral: Negative.     Objective:  Physical Exam  Constitutional: He is oriented to person, place, and time. He appears well-developed.  HENT:  Head: Normocephalic.  Eyes: EOM are normal.  Neck: Normal range of motion.  Cardiovascular: Normal rate, regular rhythm, normal heart sounds and intact distal pulses.   Respiratory: Effort normal and breath sounds normal.  GI: Soft. Bowel sounds are normal.  Genitourinary:  Deferred   Musculoskeletal:  Right knee pain with ROM. Calf soft and non tender. RLE N/V intact.  Neurological: He is alert and oriented to person, place, and time.  Skin: Skin is warm and dry.  Psychiatric: His behavior is normal.    Vital signs in last 24 hours:    Labs:   Estimated body mass index is 29.99 kg/(m^2) as calculated from the following:   Height as of 07/16/14: 5\' 10"  (1.778 m).   Weight as of 07/16/14: 94.802 kg (209 lb).   Imaging Review Plain radiographs demonstrate moderate degenerative joint disease of the right knee(s). The overall alignment ismild varus. The bone quality appears to be good for age and reported activity level.  Assessment/Plan:  End stage arthritis, right knee   The patient history, physical examination, clinical judgment of the provider and imaging studies are consistent with end stage degenerative joint disease of the right knee(s) and total knee arthroplasty is deemed medically necessary. The treatment options including medical management, injection therapy arthroscopy and arthroplasty were discussed at length. The risks and benefits of total knee arthroplasty were presented and reviewed. The risks due to aseptic loosening, infection, stiffness, patella tracking problems, thromboembolic complications and other imponderables were discussed. The patient acknowledged the explanation, agreed to proceed with the plan and consent was signed. Patient is being admitted for inpatient treatment for surgery, pain control, PT, OT, prophylactic antibiotics, VTE prophylaxis, progressive ambulation and ADL's and discharge planning. The patient is planning to be discharged home with home health services   Contraindications and adverse affects of Tranexamic acid discussed in detail. Patient denies any of these at this time and understands the risks and benefits.

## 2014-12-06 ENCOUNTER — Encounter (HOSPITAL_COMMUNITY): Payer: Self-pay

## 2014-12-10 NOTE — Patient Instructions (Addendum)
Reginald Chambers  12/10/2014   Your procedure is scheduled on: 12/24/14   Report to [facility] Main  Entrance and follow signs to               [name] at 12:30 PM.   Call this number if you have problems the morning of surgery [phone]   Remember:  Do not eat food  :After Midnight.            MAY HAVE CLEAR LIQUIDS UNTIL 8:30 AM     CLEAR LIQUID DIET   Foods Allowed                                                                     Foods Excluded  Coffee and tea, regular and decaf                             liquids that you cannot  Plain Jell-O in any flavor                                             see through such as: Fruit ices (not with fruit pulp)                                     milk, soups, orange juice  Iced Popsicles                                    All solid food Carbonated beverages, regular and diet                                    Cranberry, grape and apple juices Sports drinks like Gatorade Lightly seasoned clear broth or consume(fat free) Sugar, honey syrup   _____________________________________________________________________    Take these medicines the morning of surgery with A SIP OF WATER: NONE                               You may not have any metal on your body including hair pins and              piercings  Do not wear jewelry, make-up, lotions, powders or perfumes.             Do not wear nail polish.  Do not shave  48 hours prior to surgery.              Men may shave face and neck.   Do not bring valuables to the hospital. Reginald Chambers.  Contacts, dentures or bridgework may not be worn into surgery.  Leave suitcase in the  car. After surgery it may be brought to your room.     Patients discharged the day of surgery will not be allowed to drive home.  Name and phone number of your driver:  Special Instructions: N/A              Please read over  the following fact sheets you were given: _____________________________________________________________________                                                     Reginald Chambers  Before surgery, you can play an important role.  Because skin is not sterile, your skin needs to be as free of germs as possible.  You can reduce the number of germs on your skin by washing with CHG (chlorahexidine gluconate) soap before surgery.  CHG is an antiseptic cleaner which kills germs and bonds with the skin to continue killing germs even after washing. Please DO NOT use if you have an allergy to CHG or antibacterial soaps.  If your skin becomes reddened/irritated stop using the CHG and inform your nurse when you arrive at Short Stay. Do not shave (including legs and underarms) for at least 48 hours prior to the first CHG shower.  You may shave your face. Please follow these instructions carefully:   1.  Shower with CHG Soap the night before surgery and the  morning of Surgery.   2.  If you choose to wash your hair, wash your hair first as usual with your  normal  Shampoo.   3.  After you shampoo, rinse your hair and body thoroughly to remove the  shampoo.                                         4.  Use CHG as you would any other liquid soap.  You can apply chg directly  to the skin and wash . Gently wash with scrungie or clean wascloth    5.  Apply the CHG Soap to your body ONLY FROM THE NECK DOWN.   Do not use on open                           Wound or open sores. Avoid contact with eyes, ears mouth and genitals (private parts).                        Genitals (private parts) with your normal soap.              6.  Wash thoroughly, paying special attention to the area where your surgery  will be performed.   7.  Thoroughly rinse your body with warm water from the neck down.   8.  DO NOT shower/wash with your normal soap after using and rinsing off  the CHG Soap .                 9.  Pat yourself dry with a clean towel.             10.  Wear clean pajamas.             11.  Place clean  sheets on your bed the night of your first shower and do not  sleep with pets.  Day of Surgery : Do not apply any lotions/deodorants the morning of surgery.  Please wear clean clothes to the hospital/surgery center.  FAILURE TO FOLLOW THESE INSTRUCTIONS MAY RESULT IN THE CANCELLATION OF YOUR SURGERY    PATIENT SIGNATURE_________________________________  ______________________________________________________________________     Reginald Chambers  An incentive spirometer is a tool that can help keep your lungs clear and active. This tool measures how well you are filling your lungs with each breath. Taking long deep breaths may help reverse or decrease the chance of developing breathing (pulmonary) problems (especially infection) following:  A long period of time when you are unable to move or be active. BEFORE THE PROCEDURE   If the spirometer includes an indicator to show your best effort, your nurse or respiratory therapist will set it to a desired goal.  If possible, sit up straight or lean slightly forward. Try not to slouch.  Hold the incentive spirometer in an upright position. INSTRUCTIONS FOR USE  1. Sit on the edge of your bed if possible, or sit up as far as you can in bed or on a chair. 2. Hold the incentive spirometer in an upright position. 3. Breathe out normally. 4. Place the mouthpiece in your mouth and seal your lips tightly around it. 5. Breathe in slowly and as deeply as possible, raising the piston or the ball toward the top of the column. 6. Hold your breath for 3-5 seconds or for as long as possible. Allow the piston or ball to fall to the bottom of the column. 7. Remove the mouthpiece from your mouth and breathe out normally. 8. Rest for a few seconds and repeat Steps 1 through 7 at least 10 times every 1-2 hours when you are awake. Take your  time and take a few normal breaths between deep breaths. 9. The spirometer may include an indicator to show your best effort. Use the indicator as a goal to work toward during each repetition. 10. After each set of 10 deep breaths, practice coughing to be sure your lungs are clear. If you have an incision (the cut made at the time of surgery), support your incision when coughing by placing a pillow or rolled up towels firmly against it. Once you are able to get out of bed, walk around indoors and cough well. You may stop using the incentive spirometer when instructed by your caregiver.  RISKS AND COMPLICATIONS  Take your time so you do not get dizzy or light-headed.  If you are in pain, you may need to take or ask for pain medication before doing incentive spirometry. It is harder to take a deep breath if you are having pain. AFTER USE  Rest and breathe slowly and easily.  It can be helpful to keep track of a log of your progress. Your caregiver can provide you with a simple table to help with this. If you are using the spirometer at home, follow these instructions: [place] IF:   You are having difficultly using the spirometer.  You have trouble using the spirometer as often as instructed.  Your pain medication is not giving enough relief while using the spirometer.  You develop fever of 100.5 F (38.1 C) or higher. SEEK IMMEDIATE MEDICAL CARE IF:   You cough up bloody sputum that had not been present before.  You develop fever of 102 F (38.9 C) or greater.  You develop worsening pain at or near the incision site. MAKE SURE YOU:   Understand these instructions.  Will watch your condition.  Will get help right away if you are not doing well or get worse. Document Released: 03/28/2007 Document Revised: 02/07/2012 Document Reviewed: 05/29/2007 [facility] Patient Information 2014 [place],  [place].   ________________________________________________________________________

## 2014-12-11 ENCOUNTER — Other Ambulatory Visit: Payer: Self-pay

## 2014-12-11 ENCOUNTER — Encounter (HOSPITAL_COMMUNITY): Payer: Self-pay

## 2014-12-11 ENCOUNTER — Ambulatory Visit (HOSPITAL_COMMUNITY)
Admission: RE | Admit: 2014-12-11 | Discharge: 2014-12-11 | Disposition: A | Discharge status: Home / Self Care | Payer: 59 | Source: Ambulatory Visit | Attending: Anesthesiology | Admitting: Anesthesiology

## 2014-12-11 ENCOUNTER — Encounter (HOSPITAL_COMMUNITY)
Admission: RE | Admit: 2014-12-11 | Discharge: 2014-12-11 | Disposition: A | Discharge status: Home / Self Care | Payer: 59 | Source: Ambulatory Visit | Attending: Specialist | Admitting: Specialist

## 2014-12-11 DIAGNOSIS — M179 Osteoarthritis of knee, unspecified: Secondary | ICD-10-CM | POA: Diagnosis not present

## 2014-12-11 DIAGNOSIS — K219 Gastro-esophageal reflux disease without esophagitis: Secondary | ICD-10-CM | POA: Diagnosis not present

## 2014-12-11 DIAGNOSIS — Z01812 Encounter for preprocedural laboratory examination: Secondary | ICD-10-CM | POA: Diagnosis not present

## 2014-12-11 DIAGNOSIS — R918 Other nonspecific abnormal finding of lung field: Secondary | ICD-10-CM | POA: Diagnosis not present

## 2014-12-11 DIAGNOSIS — Z87891 Personal history of nicotine dependence: Secondary | ICD-10-CM | POA: Diagnosis not present

## 2014-12-11 DIAGNOSIS — E782 Mixed hyperlipidemia: Secondary | ICD-10-CM | POA: Insufficient documentation

## 2014-12-11 DIAGNOSIS — I10 Essential (primary) hypertension: Secondary | ICD-10-CM

## 2014-12-11 DIAGNOSIS — Z01818 Encounter for other preprocedural examination: Secondary | ICD-10-CM | POA: Diagnosis not present

## 2014-12-11 DIAGNOSIS — E119 Type 2 diabetes mellitus without complications: Secondary | ICD-10-CM | POA: Diagnosis not present

## 2014-12-11 HISTORY — DX: Tinnitus, unspecified ear: H93.19

## 2014-12-11 HISTORY — DX: Personal history of other malignant neoplasm of skin: Z85.828

## 2014-12-11 LAB — URINALYSIS, ROUTINE W REFLEX MICROSCOPIC
Bilirubin Urine: NEGATIVE
Glucose, UA: >1000 mg/dL — AB
HGB URINE DIPSTICK: NEGATIVE
Ketones, ur: NEGATIVE mg/dL
Leukocytes, UA: NEGATIVE
NITRITE: NEGATIVE
Protein, ur: NEGATIVE mg/dL
SPECIFIC GRAVITY, URINE: 1.034 — AB (ref 1.005–1.030)
UROBILINOGEN UA: 0.2 mg/dL (ref 0.0–1.0)
pH: 5 (ref 5.0–8.0)

## 2014-12-11 LAB — BASIC METABOLIC PANEL
Anion gap: 9 (ref 5–15)
BUN: 31 mg/dL — AB (ref 6–23)
CO2: 28 mmol/L (ref 19–32)
Calcium: 9.8 mg/dL (ref 8.4–10.5)
Chloride: 100 mEq/L (ref 96–112)
Creatinine, Ser: 1.02 mg/dL (ref 0.50–1.35)
GFR calc Af Amer: 86 mL/min — ABNORMAL LOW (ref >90)
GFR, EST NON AFRICAN AMERICAN: 74 mL/min — AB (ref >90)
GLUCOSE: 85 mg/dL (ref 70–99)
POTASSIUM: 4.6 mmol/L (ref 3.5–5.1)
Sodium: 137 mmol/L (ref 135–145)

## 2014-12-11 LAB — APTT: aPTT: 28 seconds (ref 24–37)

## 2014-12-11 LAB — CBC
HEMATOCRIT: 42.7 % (ref 39.0–52.0)
Hemoglobin: 14.1 g/dL (ref 13.0–17.0)
MCH: 29.7 pg (ref 26.0–34.0)
MCHC: 33 g/dL (ref 30.0–36.0)
MCV: 90.1 fL (ref 78.0–100.0)
Platelets: 193 10*3/uL (ref 150–400)
RBC: 4.74 MIL/uL (ref 4.22–5.81)
RDW: 12.5 % (ref 11.5–15.5)
WBC: 6.6 10*3/uL (ref 4.0–10.5)

## 2014-12-11 LAB — SURGICAL PCR SCREEN
MRSA, PCR: NEGATIVE
STAPHYLOCOCCUS AUREUS: NEGATIVE

## 2014-12-11 LAB — PROTIME-INR
INR: 0.94 (ref 0.00–1.49)
PROTHROMBIN TIME: 12.7 s (ref 11.6–15.2)

## 2014-12-11 LAB — URINE MICROSCOPIC-ADD ON

## 2014-12-11 NOTE — Progress Notes (Signed)
   12/11/14 1121  OBSTRUCTIVE SLEEP APNEA  Have you ever been diagnosed with sleep apnea through a sleep study? No  Do you snore loudly (loud enough to be heard through closed doors)?  0  Do you often feel tired, fatigued, or sleepy during the daytime? 0  Has anyone observed you stop breathing during your sleep? 0  Do you have, or are you being treated for high blood pressure? 1  BMI more than 35 kg/m2? 0  Age over 68 years old? 1  Neck circumference greater than 40 cm/16 inches? 1  Gender: 1  Obstructive Sleep Apnea Score 4  Score 4 or greater  Results sent to PCP

## 2014-12-11 NOTE — Progress Notes (Signed)
Abnormal BMET / UA / CXR FAXED TO DR. Theda Sers

## 2014-12-19 ENCOUNTER — Other Ambulatory Visit (HOSPITAL_COMMUNITY): Payer: Self-pay | Admitting: Internal Medicine

## 2014-12-19 DIAGNOSIS — R05 Cough: Secondary | ICD-10-CM

## 2014-12-19 DIAGNOSIS — R059 Cough, unspecified: Secondary | ICD-10-CM

## 2014-12-19 DIAGNOSIS — R9389 Abnormal findings on diagnostic imaging of other specified body structures: Secondary | ICD-10-CM

## 2014-12-23 ENCOUNTER — Ambulatory Visit (HOSPITAL_COMMUNITY)
Admission: RE | Admit: 2014-12-23 | Discharge: 2014-12-23 | Disposition: A | Discharge status: Home / Self Care | Payer: 59 | Source: Ambulatory Visit | Attending: Internal Medicine | Admitting: Internal Medicine

## 2014-12-23 DIAGNOSIS — R918 Other nonspecific abnormal finding of lung field: Secondary | ICD-10-CM | POA: Diagnosis present

## 2014-12-23 DIAGNOSIS — R059 Cough, unspecified: Secondary | ICD-10-CM

## 2014-12-23 DIAGNOSIS — R05 Cough: Secondary | ICD-10-CM

## 2014-12-23 DIAGNOSIS — R9389 Abnormal findings on diagnostic imaging of other specified body structures: Secondary | ICD-10-CM

## 2014-12-24 ENCOUNTER — Inpatient Hospital Stay (HOSPITAL_COMMUNITY)
Admission: RE | Admit: 2014-12-24 | Discharge: 2014-12-27 | DRG: 470 | Disposition: A | Discharge status: Home Health | Payer: 59 | Source: Ambulatory Visit | Attending: Specialist | Admitting: Specialist

## 2014-12-24 ENCOUNTER — Encounter (HOSPITAL_COMMUNITY)
Admission: RE | Disposition: A | Discharge status: Home Health | Payer: Self-pay | Source: Ambulatory Visit | Attending: Specialist

## 2014-12-24 ENCOUNTER — Encounter (HOSPITAL_COMMUNITY): Payer: Self-pay | Admitting: *Deleted

## 2014-12-24 ENCOUNTER — Inpatient Hospital Stay (HOSPITAL_COMMUNITY): Payer: 59 | Admitting: Anesthesiology

## 2014-12-24 DIAGNOSIS — R3915 Urgency of urination: Secondary | ICD-10-CM | POA: Diagnosis not present

## 2014-12-24 DIAGNOSIS — R110 Nausea: Secondary | ICD-10-CM | POA: Diagnosis not present

## 2014-12-24 DIAGNOSIS — I10 Essential (primary) hypertension: Secondary | ICD-10-CM | POA: Diagnosis present

## 2014-12-24 DIAGNOSIS — E782 Mixed hyperlipidemia: Secondary | ICD-10-CM | POA: Diagnosis present

## 2014-12-24 DIAGNOSIS — Z833 Family history of diabetes mellitus: Secondary | ICD-10-CM

## 2014-12-24 DIAGNOSIS — K219 Gastro-esophageal reflux disease without esophagitis: Secondary | ICD-10-CM | POA: Diagnosis present

## 2014-12-24 DIAGNOSIS — Z8249 Family history of ischemic heart disease and other diseases of the circulatory system: Secondary | ICD-10-CM

## 2014-12-24 DIAGNOSIS — E119 Type 2 diabetes mellitus without complications: Secondary | ICD-10-CM | POA: Diagnosis present

## 2014-12-24 DIAGNOSIS — Z85828 Personal history of other malignant neoplasm of skin: Secondary | ICD-10-CM

## 2014-12-24 DIAGNOSIS — Z96659 Presence of unspecified artificial knee joint: Secondary | ICD-10-CM

## 2014-12-24 DIAGNOSIS — M1711 Unilateral primary osteoarthritis, right knee: Secondary | ICD-10-CM | POA: Diagnosis present

## 2014-12-24 DIAGNOSIS — Z96652 Presence of left artificial knee joint: Secondary | ICD-10-CM | POA: Diagnosis present

## 2014-12-24 DIAGNOSIS — M179 Osteoarthritis of knee, unspecified: Secondary | ICD-10-CM | POA: Diagnosis present

## 2014-12-24 DIAGNOSIS — Z880 Allergy status to penicillin: Secondary | ICD-10-CM

## 2014-12-24 DIAGNOSIS — Z86010 Personal history of colonic polyps: Secondary | ICD-10-CM

## 2014-12-24 DIAGNOSIS — Z87891 Personal history of nicotine dependence: Secondary | ICD-10-CM

## 2014-12-24 HISTORY — PX: TOTAL KNEE ARTHROPLASTY: SHX125

## 2014-12-24 LAB — GLUCOSE, CAPILLARY
Glucose-Capillary: 115 mg/dL — ABNORMAL HIGH (ref 70–99)
Glucose-Capillary: 117 mg/dL — ABNORMAL HIGH (ref 70–99)

## 2014-12-24 LAB — TYPE AND SCREEN
ABO/RH(D): O POS
Antibody Screen: NEGATIVE

## 2014-12-24 SURGERY — ARTHROPLASTY, KNEE, TOTAL
Anesthesia: Spinal | Site: Knee | Laterality: Right

## 2014-12-24 MED ORDER — MIDAZOLAM HCL 5 MG/5ML IJ SOLN
INTRAMUSCULAR | Status: DC | PRN
  Administered 2014-12-24: 2 mg via INTRAVENOUS

## 2014-12-24 MED ORDER — DEXAMETHASONE SODIUM PHOSPHATE 10 MG/ML IJ SOLN
10.0000 mg | Freq: Once | INTRAMUSCULAR | Status: AC
  Administered 2014-12-24: 10 mg via INTRAVENOUS

## 2014-12-24 MED ORDER — VANCOMYCIN HCL IN DEXTROSE 1-5 GM/200ML-% IV SOLN
1000.0000 mg | INTRAVENOUS | Status: AC
  Administered 2014-12-24: 1000 mg via INTRAVENOUS

## 2014-12-24 MED ORDER — PROPOFOL 10 MG/ML IV BOLUS
INTRAVENOUS | Status: AC
  Filled 2014-12-24: qty 20

## 2014-12-24 MED ORDER — BUPIVACAINE HCL (PF) 0.75 % IJ SOLN
INTRAMUSCULAR | Status: DC | PRN
  Administered 2014-12-24: 15 mg via INTRATHECAL

## 2014-12-24 MED ORDER — PROPOFOL INFUSION 10 MG/ML OPTIME
INTRAVENOUS | Status: DC | PRN
  Administered 2014-12-24: 70 ug/kg/min via INTRAVENOUS

## 2014-12-24 MED ORDER — SODIUM CHLORIDE 0.9 % IV SOLN: INTRAVENOUS | Status: DC

## 2014-12-24 MED ORDER — LACTATED RINGERS IV SOLN
INTRAVENOUS | Status: DC
  Administered 2014-12-24: 1000 mL via INTRAVENOUS
  Administered 2014-12-24: 16:00:00 via INTRAVENOUS

## 2014-12-24 MED ORDER — KETOROLAC TROMETHAMINE 30 MG/ML IJ SOLN
INTRAMUSCULAR | Status: DC | PRN
  Administered 2014-12-24: 30 mg via INTRAMUSCULAR

## 2014-12-24 MED ORDER — POTASSIUM CHLORIDE IN NACL 20-0.9 MEQ/L-% IV SOLN
INTRAVENOUS | Status: DC
  Administered 2014-12-24: 21:00:00 via INTRAVENOUS
  Filled 2014-12-24 (×6): qty 1000

## 2014-12-24 MED ORDER — ENOXAPARIN SODIUM 30 MG/0.3ML ~~LOC~~ SOLN
30.0000 mg | Freq: Two times a day (BID) | SUBCUTANEOUS | Status: DC
  Administered 2014-12-25 – 2014-12-27 (×5): 30 mg via SUBCUTANEOUS
  Filled 2014-12-24 (×7): qty 0.3

## 2014-12-24 MED ORDER — TELMISARTAN-HCTZ 40-12.5 MG PO TABS: 1.0000 | ORAL_TABLET | Freq: Every morning | ORAL | Status: DC

## 2014-12-24 MED ORDER — BUPIVACAINE-EPINEPHRINE 0.25% -1:200000 IJ SOLN
INTRAMUSCULAR | Status: DC | PRN
  Administered 2014-12-24: 30 mL

## 2014-12-24 MED ORDER — DOCUSATE SODIUM 100 MG PO CAPS
100.0000 mg | ORAL_CAPSULE | Freq: Two times a day (BID) | ORAL | Status: DC
  Administered 2014-12-24 – 2014-12-27 (×6): 100 mg via ORAL

## 2014-12-24 MED ORDER — SODIUM CHLORIDE 0.9 % IJ SOLN
INTRAMUSCULAR | Status: AC
  Filled 2014-12-24: qty 50

## 2014-12-24 MED ORDER — HYDROMORPHONE HCL 1 MG/ML IJ SOLN
0.5000 mg | INTRAMUSCULAR | Status: DC | PRN
  Administered 2014-12-26: 0.5 mg via INTRAVENOUS
  Filled 2014-12-24: qty 1

## 2014-12-24 MED ORDER — IRBESARTAN 150 MG PO TABS
150.0000 mg | ORAL_TABLET | Freq: Every day | ORAL | Status: DC
  Administered 2014-12-25 – 2014-12-27 (×3): 150 mg via ORAL
  Filled 2014-12-24 (×4): qty 1

## 2014-12-24 MED ORDER — MAGNESIUM CITRATE PO SOLN: 1.0000 | Freq: Once | ORAL | Status: AC | PRN

## 2014-12-24 MED ORDER — SIMVASTATIN 40 MG PO TABS
40.0000 mg | ORAL_TABLET | Freq: Every day | ORAL | Status: DC
  Administered 2014-12-24 – 2014-12-26 (×3): 40 mg via ORAL
  Filled 2014-12-24 (×6): qty 1

## 2014-12-24 MED ORDER — PHENOL 1.4 % MT LIQD
1.0000 | OROMUCOSAL | Status: DC | PRN
  Filled 2014-12-24: qty 177

## 2014-12-24 MED ORDER — TRANEXAMIC ACID 100 MG/ML IV SOLN
1000.0000 mg | INTRAVENOUS | Status: AC
  Administered 2014-12-24: 1000 mg via INTRAVENOUS
  Filled 2014-12-24: qty 10

## 2014-12-24 MED ORDER — ONDANSETRON HCL 4 MG/2ML IJ SOLN
4.0000 mg | Freq: Four times a day (QID) | INTRAMUSCULAR | Status: DC | PRN
  Administered 2014-12-26: 4 mg via INTRAVENOUS
  Filled 2014-12-24: qty 2

## 2014-12-24 MED ORDER — ACETAMINOPHEN 650 MG RE SUPP: 650.0000 mg | Freq: Four times a day (QID) | RECTAL | Status: DC | PRN

## 2014-12-24 MED ORDER — OXYCODONE HCL 5 MG PO TABS
5.0000 mg | ORAL_TABLET | ORAL | Status: DC | PRN
  Administered 2014-12-24 – 2014-12-25 (×4): 5 mg via ORAL
  Administered 2014-12-25 – 2014-12-27 (×9): 10 mg via ORAL
  Filled 2014-12-24 (×3): qty 2
  Filled 2014-12-24 (×2): qty 1
  Filled 2014-12-24: qty 2
  Filled 2014-12-24 (×2): qty 1
  Filled 2014-12-24 (×7): qty 2

## 2014-12-24 MED ORDER — CELECOXIB 200 MG PO CAPS
200.0000 mg | ORAL_CAPSULE | Freq: Two times a day (BID) | ORAL | Status: DC | PRN
  Filled 2014-12-24: qty 1

## 2014-12-24 MED ORDER — GLIPIZIDE 5 MG PO TABS
5.0000 mg | ORAL_TABLET | Freq: Two times a day (BID) | ORAL | Status: DC
  Administered 2014-12-25 – 2014-12-27 (×5): 5 mg via ORAL
  Filled 2014-12-24 (×8): qty 1

## 2014-12-24 MED ORDER — DEXAMETHASONE SODIUM PHOSPHATE 10 MG/ML IJ SOLN
INTRAMUSCULAR | Status: AC
  Filled 2014-12-24: qty 1

## 2014-12-24 MED ORDER — KETOROLAC TROMETHAMINE 30 MG/ML IJ SOLN
INTRAMUSCULAR | Status: AC
  Filled 2014-12-24: qty 1

## 2014-12-24 MED ORDER — DIPHENHYDRAMINE HCL 50 MG/ML IJ SOLN
INTRAMUSCULAR | Status: AC
  Filled 2014-12-24: qty 1

## 2014-12-24 MED ORDER — SODIUM CHLORIDE 0.9 % IR SOLN
Status: DC | PRN
  Administered 2014-12-24: 3000 mL

## 2014-12-24 MED ORDER — ONDANSETRON HCL 4 MG/2ML IJ SOLN
INTRAMUSCULAR | Status: DC | PRN
  Administered 2014-12-24: 4 mg via INTRAVENOUS

## 2014-12-24 MED ORDER — METOCLOPRAMIDE HCL 5 MG/ML IJ SOLN: 5.0000 mg | Freq: Three times a day (TID) | INTRAMUSCULAR | Status: DC | PRN

## 2014-12-24 MED ORDER — DEXTROSE 5 % IV SOLN
500.0000 mg | Freq: Four times a day (QID) | INTRAVENOUS | Status: DC | PRN
  Filled 2014-12-24: qty 5

## 2014-12-24 MED ORDER — ONDANSETRON HCL 4 MG/2ML IJ SOLN
INTRAMUSCULAR | Status: AC
  Filled 2014-12-24: qty 2

## 2014-12-24 MED ORDER — CANAGLIFLOZIN 100 MG PO TABS
100.0000 mg | ORAL_TABLET | Freq: Every day | ORAL | Status: DC
  Administered 2014-12-24 – 2014-12-27 (×4): 100 mg via ORAL
  Filled 2014-12-24 (×5): qty 1

## 2014-12-24 MED ORDER — POVIDONE-IODINE 7.5 % EX SOLN: Freq: Once | CUTANEOUS | Status: DC

## 2014-12-24 MED ORDER — BUPIVACAINE HCL (PF) 0.25 % IJ SOLN
INTRAMUSCULAR | Status: AC
  Filled 2014-12-24: qty 30

## 2014-12-24 MED ORDER — BUPIVACAINE LIPOSOME 1.3 % IJ SUSP
20.0000 mL | Freq: Once | INTRAMUSCULAR | Status: DC
  Filled 2014-12-24: qty 20

## 2014-12-24 MED ORDER — FENTANYL CITRATE 0.05 MG/ML IJ SOLN
INTRAMUSCULAR | Status: DC | PRN
  Administered 2014-12-24: 100 ug via INTRAVENOUS

## 2014-12-24 MED ORDER — ACETAMINOPHEN 10 MG/ML IV SOLN
1000.0000 mg | Freq: Once | INTRAVENOUS | Status: AC
  Administered 2014-12-24: 1000 mg via INTRAVENOUS
  Filled 2014-12-24: qty 100

## 2014-12-24 MED ORDER — BUPIVACAINE-EPINEPHRINE (PF) 0.25% -1:200000 IJ SOLN
INTRAMUSCULAR | Status: AC
  Filled 2014-12-24: qty 30

## 2014-12-24 MED ORDER — FENTANYL CITRATE 0.05 MG/ML IJ SOLN
INTRAMUSCULAR | Status: AC
  Filled 2014-12-24: qty 2

## 2014-12-24 MED ORDER — MENTHOL 3 MG MT LOZG
1.0000 | LOZENGE | OROMUCOSAL | Status: DC | PRN
  Filled 2014-12-24: qty 9

## 2014-12-24 MED ORDER — ACETAMINOPHEN 325 MG PO TABS
650.0000 mg | ORAL_TABLET | Freq: Four times a day (QID) | ORAL | Status: DC | PRN
  Administered 2014-12-26: 650 mg via ORAL
  Filled 2014-12-24: qty 2

## 2014-12-24 MED ORDER — BISACODYL 5 MG PO TBEC: 5.0000 mg | DELAYED_RELEASE_TABLET | Freq: Every day | ORAL | Status: DC | PRN

## 2014-12-24 MED ORDER — HYDROMORPHONE HCL 1 MG/ML IJ SOLN: 0.2500 mg | INTRAMUSCULAR | Status: DC | PRN

## 2014-12-24 MED ORDER — ALUM & MAG HYDROXIDE-SIMETH 200-200-20 MG/5ML PO SUSP: 30.0000 mL | ORAL | Status: DC | PRN

## 2014-12-24 MED ORDER — DIPHENHYDRAMINE HCL 12.5 MG/5ML PO ELIX: 12.5000 mg | ORAL_SOLUTION | ORAL | Status: DC | PRN

## 2014-12-24 MED ORDER — ZOLPIDEM TARTRATE 5 MG PO TABS
5.0000 mg | ORAL_TABLET | Freq: Every evening | ORAL | Status: DC | PRN
Start: 2014-12-24 — End: 2014-12-27
  Administered 2014-12-26: 5 mg via ORAL
  Filled 2014-12-24: qty 1

## 2014-12-24 MED ORDER — SODIUM CHLORIDE 0.9 % IJ SOLN
INTRAMUSCULAR | Status: AC
  Filled 2014-12-24: qty 10

## 2014-12-24 MED ORDER — LINAGLIPTIN 5 MG PO TABS
5.0000 mg | ORAL_TABLET | Freq: Every day | ORAL | Status: DC
  Administered 2014-12-24 – 2014-12-27 (×4): 5 mg via ORAL
  Filled 2014-12-24 (×5): qty 1

## 2014-12-24 MED ORDER — ONDANSETRON HCL 4 MG PO TABS: 4.0000 mg | ORAL_TABLET | Freq: Four times a day (QID) | ORAL | Status: DC | PRN

## 2014-12-24 MED ORDER — DEXAMETHASONE SODIUM PHOSPHATE 10 MG/ML IJ SOLN
10.0000 mg | Freq: Once | INTRAMUSCULAR | Status: DC
  Filled 2014-12-24: qty 1

## 2014-12-24 MED ORDER — MIDAZOLAM HCL 2 MG/2ML IJ SOLN
INTRAMUSCULAR | Status: AC
  Filled 2014-12-24: qty 2

## 2014-12-24 MED ORDER — BUPIVACAINE HCL (PF) 0.5 % IJ SOLN
INTRAMUSCULAR | Status: AC
  Filled 2014-12-24: qty 30

## 2014-12-24 MED ORDER — VANCOMYCIN HCL IN DEXTROSE 1-5 GM/200ML-% IV SOLN
1000.0000 mg | Freq: Two times a day (BID) | INTRAVENOUS | Status: AC
  Administered 2014-12-25: 1000 mg via INTRAVENOUS
  Filled 2014-12-24: qty 200

## 2014-12-24 MED ORDER — POLYETHYLENE GLYCOL 3350 17 G PO PACK: 17.0000 g | PACK | Freq: Every day | ORAL | Status: DC | PRN

## 2014-12-24 MED ORDER — SODIUM CHLORIDE 0.9 % IJ SOLN
INTRAMUSCULAR | Status: DC | PRN
  Administered 2014-12-24: 30 mL

## 2014-12-24 MED ORDER — METFORMIN HCL ER 500 MG PO TB24
1000.0000 mg | ORAL_TABLET | Freq: Two times a day (BID) | ORAL | Status: DC
  Administered 2014-12-25 – 2014-12-27 (×5): 1000 mg via ORAL
  Filled 2014-12-24 (×7): qty 2

## 2014-12-24 MED ORDER — METHOCARBAMOL 500 MG PO TABS
500.0000 mg | ORAL_TABLET | Freq: Four times a day (QID) | ORAL | Status: DC | PRN
Start: 2014-12-24 — End: 2014-12-27
  Administered 2014-12-25 – 2014-12-27 (×8): 500 mg via ORAL
  Filled 2014-12-24 (×10): qty 1

## 2014-12-24 MED ORDER — METOCLOPRAMIDE HCL 10 MG PO TABS: 5.0000 mg | ORAL_TABLET | Freq: Three times a day (TID) | ORAL | Status: DC | PRN

## 2014-12-24 MED ORDER — FERROUS SULFATE 325 (65 FE) MG PO TABS
325.0000 mg | ORAL_TABLET | Freq: Three times a day (TID) | ORAL | Status: DC
  Administered 2014-12-25 – 2014-12-27 (×4): 325 mg via ORAL
  Filled 2014-12-24 (×11): qty 1

## 2014-12-24 MED ORDER — VANCOMYCIN HCL IN DEXTROSE 1-5 GM/200ML-% IV SOLN
INTRAVENOUS | Status: AC
  Filled 2014-12-24: qty 200

## 2014-12-24 MED ORDER — PROMETHAZINE HCL 25 MG/ML IJ SOLN: 6.2500 mg | INTRAMUSCULAR | Status: DC | PRN

## 2014-12-24 MED ORDER — HYDROCHLOROTHIAZIDE 12.5 MG PO CAPS
12.5000 mg | ORAL_CAPSULE | Freq: Every day | ORAL | Status: DC
  Administered 2014-12-25 – 2014-12-27 (×3): 12.5 mg via ORAL
  Filled 2014-12-24 (×4): qty 1

## 2014-12-24 SURGICAL SUPPLY — 68 items
BAG SPEC THK2 15X12 ZIP CLS (MISCELLANEOUS) ×1
BAG ZIPLOCK 12X15 (MISCELLANEOUS) ×4 IMPLANT
BANDAGE ELASTIC 4 VELCRO ST LF (GAUZE/BANDAGES/DRESSINGS) ×3 IMPLANT
BANDAGE ELASTIC 6 VELCRO ST LF (GAUZE/BANDAGES/DRESSINGS) ×3 IMPLANT
BANDAGE ESMARK 6X9 LF (GAUZE/BANDAGES/DRESSINGS) ×1 IMPLANT
BLADE SAG 18X100X1.27 (BLADE) ×3 IMPLANT
BLADE SAW SGTL 13.0X1.19X90.0M (BLADE) ×3 IMPLANT
BNDG CMPR 9X6 STRL LF SNTH (GAUZE/BANDAGES/DRESSINGS) ×1
BNDG ESMARK 6X9 LF (GAUZE/BANDAGES/DRESSINGS) ×3
BONE CEMENT GENTAMICIN (Cement) ×6 IMPLANT
CAP KNEE TOTAL 3 SIGMA ×2 IMPLANT
CEMENT BONE GENTAMICIN 40 (Cement) IMPLANT
COVER SURGICAL LIGHT HANDLE (MISCELLANEOUS) ×2 IMPLANT
CUFF TOURN SGL QUICK 34 (TOURNIQUET CUFF) ×3
CUFF TRNQT CYL 34X4X40X1 (TOURNIQUET CUFF) ×1 IMPLANT
DRAPE EXTREMITY T 121X128X90 (DRAPE) ×3 IMPLANT
DRAPE POUCH INSTRU U-SHP 10X18 (DRAPES) ×3 IMPLANT
DRAPE SHEET LG 3/4 BI-LAMINATE (DRAPES) ×3 IMPLANT
DRAPE U-SHAPE 47X51 STRL (DRAPES) ×3 IMPLANT
DRSG AQUACEL AG ADV 3.5X10 (GAUZE/BANDAGES/DRESSINGS) ×3 IMPLANT
DRSG TEGADERM 4X4.75 (GAUZE/BANDAGES/DRESSINGS) ×3 IMPLANT
DURAPREP 26ML APPLICATOR (WOUND CARE) ×5 IMPLANT
ELECT REM PT RETURN 9FT ADLT (ELECTROSURGICAL) ×3
ELECTRODE REM PT RTRN 9FT ADLT (ELECTROSURGICAL) ×1 IMPLANT
EVACUATOR 1/8 PVC DRAIN (DRAIN) ×3 IMPLANT
FACESHIELD WRAPAROUND (MASK) ×15 IMPLANT
FACESHIELD WRAPAROUND OR TEAM (MASK) ×5 IMPLANT
GAUZE SPONGE 2X2 8PLY STRL LF (GAUZE/BANDAGES/DRESSINGS) ×1 IMPLANT
GLOVE BIOGEL PI IND STRL 8 (GLOVE) ×2 IMPLANT
GLOVE BIOGEL PI INDICATOR 8 (GLOVE) ×4
GLOVE SURG ORTHO 8.0 STRL STRW (GLOVE) ×3 IMPLANT
GLOVE SURG ORTHO 9.0 STRL STRW (GLOVE) ×3 IMPLANT
GLOVE SURG SS PI 7.5 STRL IVOR (GLOVE) ×3 IMPLANT
GOWN STRL REUS W/TWL XL LVL3 (GOWN DISPOSABLE) ×6 IMPLANT
HANDPIECE INTERPULSE COAX TIP (DISPOSABLE) ×3
IMMOBILIZER KNEE 20 (SOFTGOODS) ×2 IMPLANT
IMMOBILIZER KNEE 20 THIGH 36 (SOFTGOODS) ×1 IMPLANT
KIT BASIN OR (CUSTOM PROCEDURE TRAY) ×3 IMPLANT
LIQUID BAND (GAUZE/BANDAGES/DRESSINGS) ×3 IMPLANT
NDL SAFETY ECLIPSE 18X1.5 (NEEDLE) ×3 IMPLANT
NEEDLE HYPO 18GX1.5 SHARP (NEEDLE) ×6
NS IRRIG 1000ML POUR BTL (IV SOLUTION) ×3 IMPLANT
PACK TOTAL JOINT (CUSTOM PROCEDURE TRAY) ×3 IMPLANT
PLATE ROT INSERT 12.5MM (Plate) IMPLANT
POSITIONER SURGICAL ARM (MISCELLANEOUS) ×5 IMPLANT
SET HNDPC FAN SPRY TIP SCT (DISPOSABLE) ×1 IMPLANT
SET PAD KNEE POSITIONER (MISCELLANEOUS) ×3 IMPLANT
SPONGE GAUZE 2X2 STER 10/PKG (GAUZE/BANDAGES/DRESSINGS) ×2
SPONGE LAP 18X18 X RAY DECT (DISPOSABLE) IMPLANT
SPONGE SURGIFOAM ABS GEL 100 (HEMOSTASIS) ×2 IMPLANT
STOCKINETTE 6  STRL (DRAPES) ×2
STOCKINETTE 6 STRL (DRAPES) ×1 IMPLANT
SUCTION FRAZIER 12FR DISP (SUCTIONS) ×3 IMPLANT
SUT BONE WAX W31G (SUTURE) IMPLANT
SUT MNCRL AB 3-0 PS2 18 (SUTURE) ×3 IMPLANT
SUT VIC AB 1 CT1 27 (SUTURE) ×6
SUT VIC AB 1 CT1 27XBRD ANTBC (SUTURE) ×2 IMPLANT
SUT VIC AB 2-0 CT1 27 (SUTURE) ×6
SUT VIC AB 2-0 CT1 TAPERPNT 27 (SUTURE) ×2 IMPLANT
SUT VLOC 180 0 24IN GS25 (SUTURE) ×3 IMPLANT
SYR 50ML LL SCALE MARK (SYRINGE) ×7 IMPLANT
TAPE STRIPS DRAPE STRL (GAUZE/BANDAGES/DRESSINGS) ×3 IMPLANT
TOWEL OR 17X26 10 PK STRL BLUE (TOWEL DISPOSABLE) ×3 IMPLANT
TOWEL OR NON WOVEN STRL DISP B (DISPOSABLE) IMPLANT
TOWER CARTRIDGE SMART MIX (DISPOSABLE) ×3 IMPLANT
TRAY FOLEY CATH 14FRSI W/METER (CATHETERS) ×3 IMPLANT
WATER STERILE IRR 1500ML POUR (IV SOLUTION) ×6 IMPLANT
WRAP KNEE MAXI GEL POST OP (GAUZE/BANDAGES/DRESSINGS) ×3 IMPLANT

## 2014-12-24 NOTE — Interval H&P Note (Signed)
History and Physical Interval Note:  12/24/2014 3:36 PM  Reginald Chambers  has presented today for surgery, with the diagnosis of RIGHT KNEE OA  The various methods of treatment have been discussed with the patient and family. After consideration of risks, benefits and other options for treatment, the patient has consented to  Procedure(s): RIGHT TOTAL KNEE ARTHROPLASTY (Right) as a surgical intervention .  The patient's history has been reviewed, patient examined, no change in status, stable for surgery.  I have reviewed the patient's chart and labs.  Questions were answered to the patient's satisfaction.     Saulo Anthis ANDREW

## 2014-12-24 NOTE — Anesthesia Postprocedure Evaluation (Signed)
  Anesthesia Post-op Note  Patient: Reginald Chambers  Procedure(s) Performed: Procedure(s): RIGHT TOTAL KNEE ARTHROPLASTY (Right)  Patient Location: PACU  Anesthesia Type:Spinal  Level of Consciousness: awake  Airway and Oxygen Therapy: Patient Spontanous Breathing and Patient connected to nasal cannula oxygen  Post-op Pain: mild  Post-op Assessment: Post-op Vital signs reviewed, Patient's Cardiovascular Status Stable, Respiratory Function Stable, Patent Airway, No signs of Nausea or vomiting and Pain level controlled  Post-op Vital Signs: Reviewed and stable  Last Vitals:  Filed Vitals:   12/24/14 1859  BP: 124/70  Pulse: 48  Temp: 36.6 C  Resp: 16    Complications: No apparent anesthesia complications

## 2014-12-24 NOTE — Op Note (Signed)
DATE OF SURGERY:  12/24/2014  TIME: 5:38 PM  PATIENT NAME:  Reginald Chambers    AGE: 69 y.o.   PRE-OPERATIVE DIAGNOSIS:  RIGHT KNEE OA  POST-OPERATIVE DIAGNOSIS:  Right Knee Osteoarthritis  PROCEDURE:  Procedure(s): RIGHT TOTAL KNEE ARTHROPLASTY  SURGEON:  Jeree Delcid ANDREW  ASSISTANT:  Bryson Stilwell, PA-C, present and scrubbed throughout the case, critical for assistance with exposure, retraction, instrumentation, and closure.  OPERATIVE IMPLANTS: Depuy PFC Sigma Rotating Platform.  Femur size 5, Tibia size 4, Patella size 38 3-peg oval button, with a 12.5 mm polyethylene insert.   PREOPERATIVE INDICATIONS:   [name] is a 69 y.o. year old male with end stage bone on bone arthritis of the knee who failed conservative treatment and elected for Total Knee Arthroplasty.   The risks, benefits, and alternatives were discussed at length including but not limited to the risks of infection, bleeding, nerve injury, stiffness, blood clots, the need for revision surgery, cardiopulmonary complications, among others, and they were willing to proceed.  OPERATIVE DESCRIPTION:  The patient was brought to the operative room and placed in a supine position.  Spinal anesthesia was administered.  IV antibiotics were given.  The lower extremity was prepped and draped in the usual sterile fashion.  Time out was performed.  The leg was elevated and exsanguinated and the tourniquet was inflated.  Anterior quadriceps tendon splitting approach was performed.  The patella was retracted and osteophytes were removed.  The anterior horn of the medial and lateral meniscus was removed and cruciate ligaments resected.   The distal femur was opened with the drill and the intramedullary distal femoral cutting jig was utilized, set at 5 degrees resecting 10 mm off the distal femur.  Care was taken to protect the collateral ligaments.  The distal femoral sizing jig was applied, taking care to avoid  notching.  Then the 4-in-1 cutting jig was applied and the anterior and posterior femur was cut, along with the chamfer cuts.    Then the extramedullary tibial cutting jig was utilized making the appropriate cut using the anterior tibial crest as a reference building in appropriate posterior slope.  Care was taken during the cut to protect the medial and collateral ligaments.  The proximal tibia was removed along with the posterior horns of the menisci.   The posterior medial femoral osteophytes and posterior lateral femoral osteophytes were removed.    The flexion gap was then measured and was symmetric with the extension gap, measured at 10.  I completed the distal femoral preparation using the appropriate jig to prepare the box.  The patella was then measured, and cut with the saw.    The proximal tibia sized and prepared accordingly with the reamer and the punch, and then all components were trialed with the trial insert.  The knee was found to have excellent balance and full motion.    The above named components were then cemented into place and all excess cement was removed.  The trial polyethylene component was in place during cementation, and then was exchanged for the real polyethylene component.    The knee was easily taken through a range of motion and the patella tracked well and the knee irrigated copiously and the parapatellar and subcutaneous tissue closed with vicryl, and monocryl with steri strips for the skin.  The arthrotomy was closed at 90 of flexion. The wounds were dressed with sterile gauze and the tourniquet released and the patient was awakened and returned to the PACU  in stable and satisfactory condition.  There were no complications.  Total tourniquet time was 90 minutes.60cc of marcaine ,saline,and toradol periosteal injection. Vloc closure.

## 2014-12-24 NOTE — Interval H&P Note (Signed)
History and Physical Interval Note:  12/24/2014 3:36 PM  Reginald Chambers  has presented today for surgery, with the diagnosis of RIGHT KNEE OA  The various methods of treatment have been discussed with the patient and family. After consideration of risks, benefits and other options for treatment, the patient has consented to  Procedure(s): RIGHT TOTAL KNEE ARTHROPLASTY (Right) as a surgical intervention .  The patient's history has been reviewed, patient examined, no change in status, stable for surgery.  I have reviewed the patient's chart and labs.  Questions were answered to the patient's satisfaction.     [name]

## 2014-12-24 NOTE — Interval H&P Note (Signed)
History and Physical Interval Note:  12/24/2014 3:36 PM  Reginald Chambers  has presented today for surgery, with the diagnosis of RIGHT KNEE OA  The various methods of treatment have been discussed with the patient and family. After consideration of risks, benefits and other options for treatment, the patient has consented to  Procedure(s): RIGHT TOTAL KNEE ARTHROPLASTY (Right) as a surgical intervention .  The patient's history has been reviewed, patient examined, no change in status, stable for surgery.  I have reviewed the patient's chart and labs.  Questions were answered to the patient's satisfaction.     Careena Degraffenreid ANDREW

## 2014-12-24 NOTE — Interval H&P Note (Signed)
History and Physical Interval Note:  12/24/2014 3:36 PM  Reginald Chambers  has presented today for surgery, with the diagnosis of RIGHT KNEE OA  The various methods of treatment have been discussed with the patient and family. After consideration of risks, benefits and other options for treatment, the patient has consented to  Procedure(s): RIGHT TOTAL KNEE ARTHROPLASTY (Right) as a surgical intervention .  The patient's history has been reviewed, patient examined, no change in status, stable for surgery.  I have reviewed the patient's chart and labs.  Questions were answered to the patient's satisfaction.     Britley Gashi ANDREW

## 2014-12-24 NOTE — Anesthesia Procedure Notes (Signed)
Spinal Patient location during procedure: OR Start time: 12/24/2014 3:43 PM End time: 12/24/2014 3:44 PM Staffing Resident/CRNA: Harle Stanford R Performed by: resident/CRNA  Preanesthetic Checklist Completed: patient identified, site marked, surgical consent, pre-op evaluation, timeout performed, IV checked, risks and benefits discussed and monitors and equipment checked Spinal Block Patient position: sitting Prep: Betadine Patient monitoring: heart rate, cardiac monitor, continuous pulse ox and blood pressure Approach: midline Location: L3-4 Injection technique: single-shot Needle Needle type: Sprotte  Needle gauge: 24 G Needle length: 9 cm Needle insertion depth: 7 cm Assessment Sensory level: T4

## 2014-12-24 NOTE — Transfer of Care (Signed)
Immediate Anesthesia Transfer of Care Note  Patient: Reginald Chambers  Procedure(s) Performed: Procedure(s): RIGHT TOTAL KNEE ARTHROPLASTY (Right)  Patient Location: PACU  Anesthesia Type:Spinal  Level of Consciousness: awake, alert  and oriented  Airway & Oxygen Therapy: Patient Spontanous Breathing and Patient connected to nasal cannula oxygen  Post-op Assessment: Report given to PACU RN and Post -op Vital signs reviewed and stable  Post vital signs: Reviewed and stable  Complications: No apparent anesthesia complications

## 2014-12-24 NOTE — Anesthesia Preprocedure Evaluation (Signed)
Anesthesia Evaluation  Patient identified by MRN, date of birth, ID band Patient awake    Reviewed: Allergy & Precautions, NPO status , Patient's Chart, lab work & pertinent test results  Airway        Dental   Pulmonary former smoker,  breath sounds clear to auscultation        Cardiovascular hypertension, Rhythm:Regular Rate:Bradycardia     Neuro/Psych    GI/Hepatic   Endo/Other  diabetesMorbid obesity  Renal/GU      Musculoskeletal  (+) Arthritis -,   Abdominal (+) + obese,   Peds  Hematology   Anesthesia Other Findings   Reproductive/Obstetrics                             Anesthesia Physical Anesthesia Plan  ASA: III  Anesthesia Plan:    Post-op Pain Management:    Induction: Intravenous  Airway Management Planned:   Additional Equipment:   Intra-op Plan:   Post-operative Plan:   Informed Consent: I have reviewed the patients History and Physical, chart, labs and discussed the procedure including the risks, benefits and alternatives for the proposed anesthesia with the patient or authorized representative who has indicated his/her understanding and acceptance.     Plan Discussed with:   Anesthesia Plan Comments:         Anesthesia Quick Evaluation

## 2014-12-25 ENCOUNTER — Encounter (HOSPITAL_COMMUNITY): Payer: Self-pay | Admitting: Specialist

## 2014-12-25 LAB — BASIC METABOLIC PANEL
Anion gap: 6 (ref 5–15)
BUN: 18 mg/dL (ref 6–23)
CALCIUM: 8.6 mg/dL (ref 8.4–10.5)
CO2: 28 mmol/L (ref 19–32)
Chloride: 103 mmol/L (ref 96–112)
Creatinine, Ser: 0.89 mg/dL (ref 0.50–1.35)
GFR calc non Af Amer: 87 mL/min — ABNORMAL LOW (ref >90)
GLUCOSE: 171 mg/dL — AB (ref 70–99)
POTASSIUM: 4.4 mmol/L (ref 3.5–5.1)
Sodium: 137 mmol/L (ref 135–145)

## 2014-12-25 LAB — CBC
HCT: 37.7 % — ABNORMAL LOW (ref 39.0–52.0)
Hemoglobin: 12.5 g/dL — ABNORMAL LOW (ref 13.0–17.0)
MCH: 30 pg (ref 26.0–34.0)
MCHC: 33.2 g/dL (ref 30.0–36.0)
MCV: 90.6 fL (ref 78.0–100.0)
Platelets: 157 10*3/uL (ref 150–400)
RBC: 4.16 MIL/uL — ABNORMAL LOW (ref 4.22–5.81)
RDW: 12.6 % (ref 11.5–15.5)
WBC: 8.8 10*3/uL (ref 4.0–10.5)

## 2014-12-25 LAB — GLUCOSE, CAPILLARY: GLUCOSE-CAPILLARY: 125 mg/dL — AB (ref 70–99)

## 2014-12-25 MED ORDER — NAPHAZOLINE HCL 0.1 % OP SOLN
2.0000 [drp] | Freq: Four times a day (QID) | OPHTHALMIC | Status: DC | PRN
  Filled 2014-12-25: qty 15

## 2014-12-25 NOTE — Evaluation (Signed)
Physical Therapy Evaluation Patient Details Name: Reginald Chambers MRN: [id number] DOB: [birth year] Today's Date: 12/25/2014   History of Present Illness  Pt is s/p RIGHT TOTAL KNEE ARTHROPLASTY   Clinical Impression  Pt s/p R TKR presents with decreased R LE strength/ROM and post op pain limiting functional mobility.  Pt should progress well to d.c home with family assist and HHPT follow up.    Follow Up Recommendations Home health PT    Equipment Recommendations  None recommended by PT    Recommendations for Other Services OT consult     Precautions / Restrictions Precautions Precautions: Knee Required Braces or Orthoses:  (Pt did SLR today) Knee Immobilizer - Right: Discontinue once straight leg raise with < 10 degree lag (Pt performed IND SLR this am) Restrictions Weight Bearing Restrictions: No Other Position/Activity Restrictions: WBAT      Mobility  Bed Mobility Overal bed mobility: Modified Independent                Transfers Overall transfer level: Needs assistance Equipment used: Rolling walker (2 wheeled) Transfers: Sit to/from Stand Sit to Stand: Min guard         General transfer comment: verbal cues for hand placement.  Ambulation/Gait Ambulation/Gait assistance: Min assist;Min guard Ambulation Distance (Feet): 68 Feet Assistive device: Rolling walker (2 wheeled) Gait Pattern/deviations: Step-to pattern;Decreased step length - right;Decreased step length - left;Shuffle;Trunk flexed Gait velocity: decr      Stairs            Wheelchair Mobility    Modified Rankin (Stroke Patients Only)       Balance                                             Pertinent Vitals/Pain Pain Assessment: 0-10 Pain Score: 3  Pain Location: R knee Pain Descriptors / Indicators: Aching Pain Intervention(s): Repositioned;Ice applied    Home Living Family/patient expects to be discharged to:: Private residence Living  Arrangements: Spouse/significant other Available Help at Discharge: Family Type of Home: House Home Access: Stairs to enter Entrance Stairs-Rails: None Entrance Stairs-Number of Steps: 3 Home Layout: Two level Home Equipment: Environmental consultant - 2 wheels;Walker - standard      Prior Function Level of Independence: Independent               Hand Dominance        Extremity/Trunk Assessment   Upper Extremity Assessment: Overall WFL for tasks assessed           Lower Extremity Assessment: RLE deficits/detail RLE Deficits / Details: 3/5 quads with AAROM at knee -10 - 35    Cervical / Trunk Assessment: Normal  Communication   Communication: No difficulties  Cognition Arousal/Alertness: Awake/alert Behavior During Therapy: WFL for tasks assessed/performed Overall Cognitive Status: Within Functional Limits for tasks assessed                      General Comments General comments (skin integrity, edema, etc.): min guard standing to manage gown.    Exercises Total Joint Exercises Ankle Circles/Pumps: AROM;Both;15 reps;Supine Quad Sets: AROM;Both;10 reps;Supine Heel Slides: AAROM;Right;10 reps;Supine Straight Leg Raises: AROM;Right;10 reps;Supine      Assessment/Plan    PT Assessment Patient needs continued PT services  PT Diagnosis Difficulty walking   PT Problem List Decreased strength;Decreased range of motion;Decreased activity tolerance;Decreased mobility;Decreased knowledge of use  of DME;Pain  PT Treatment Interventions DME instruction;Gait training;Stair training;Functional mobility training;Therapeutic activities;Therapeutic exercise;Patient/family education   PT Goals (Current goals can be found in the Care Plan section) Acute Rehab PT Goals Patient Stated Goal: home when ready PT Goal Formulation: With patient Time For Goal Achievement: 01/01/15 Potential to Achieve Goals: Good    Frequency 7X/week   Barriers to discharge        Co-evaluation                End of Session   Activity Tolerance: Patient tolerated treatment well Patient left: in chair;with call bell/phone within reach Nurse Communication: Mobility status         Time: 1005-1030 PT Time Calculation (min) (ACUTE ONLY): 25 min   Charges:   PT Evaluation $Initial PT Evaluation Tier I: 1 Procedure PT Treatments $Gait Training: 8-22 mins   PT G Codes:        Reginald Chambers 2015/01/21, 12:28 PM

## 2014-12-25 NOTE — Care Management Note (Signed)
    Page 1 of 2   12/25/2014     3:26:07 PM CARE MANAGEMENT NOTE 12/25/2014  Patient:  Reginald Chambers, Reginald Chambers   Account Number:  [id number]  Date Initiated:  12/25/2014  Documentation initiated by:  [facility]  Subjective/Objective Assessment:   adm: LEFT TOTAL HIP ARTHROPLASTY ANTERIOR APPROACH (Left     Action/Plan:   discharge planning   Anticipated DC Date:  12/26/2014   Anticipated DC Plan:  [place]  CM consult      [facility] Choice  HOME HEALTH   Choice offered to / List presented to:  C-1 Patient   DME arranged  NA      DME agency  NA     [place] arranged  NA      [place] agency  NA   Status of service:  Completed, signed off Medicare Important Message given?   (If response is "NO", the following Medicare IM given date fields will be blank) Date Medicare IM given:   Medicare IM given by:   Date Additional Medicare IM given:   Additional Medicare IM given by:    Discharge Disposition:  [place]  Per UR Regulation:    If discussed at Long Length of Stay Meetings, dates discussed:    Comments:  12/25/14 15:20 CM met with pt in room to offer choice of home health agency.  Pt chooses [facility] to render HHPT. Address and contact information verified by pt with addition of girlfriend's Ginny number (given to [facility] rep). CM called [facility] DME rep, Lecretia to please deliver 3n1 to room prior to discharge. Referral given to [facility] rep, Kristen.  No other CM needs were communicated. Mariane Masters, BSN, CM [phone].

## 2014-12-25 NOTE — Progress Notes (Signed)
CSW consulted for SNF placement. PN reviewed. PT is recommending HHPT following hospital d/c. RNCM will assist with d/c planning. CSW signing off.  Werner Lean LCSW [phone]

## 2014-12-25 NOTE — Progress Notes (Signed)
Subjective: 1 Day Post-Op Procedure(s) (LRB): RIGHT TOTAL KNEE ARTHROPLASTY (Right) Patient reports pain as well copntrolled.  Tolerating PO's. Reports a good night. Denies CP, SOB, or calf pain.  Objective: Vital signs in last 24 hours: Temp:  [97.4 F (36.3 C)-98.7 F (37.1 C)] 98.3 F (36.8 C) (01/27 0500) Pulse Rate:  [46-83] 55 (01/27 0500) Resp:  [14-18] 16 (01/27 0500) BP: (115-142)/(58-79) 119/76 mmHg (01/27 0500) SpO2:  [97 %-100 %] 99 % (01/27 0500) Weight:  [97.523 kg (215 lb)] 97.523 kg (215 lb) (01/26 1136)  Intake/Output from previous day: 01/26 0701 - 01/27 0700 In: 3050 [P.O.:480; I.V.:2460; IV Piggyback:110] Out: 2950 [Urine:2725; Drains:175; Blood:50] Intake/Output this shift:     Recent Labs  12/25/14 0516  HGB 12.5*    Recent Labs  12/25/14 0516  WBC 8.8  RBC 4.16*  HCT 37.7*  PLT 157    Recent Labs  12/25/14 0516  NA 137  K 4.4  CL 103  CO2 28  BUN 18  CREATININE 0.89  GLUCOSE 171*  CALCIUM 8.6   No results for input(s): LABPT, INR in the last 72 hours.  Well nourished. Alert and oriented x3. RRR, Lungs clear, BS x4. Abdomen soft and non tender. Right Calf soft and non tender. Right knee dressing C/D/I. No DVT signs. Compartment soft. No signs of infection.  Right LE neurovascular intact.  Drain d/c'ed. Tip was intact. Pt tolerated well.  Assessment/Plan: 1 Day Post-Op Procedure(s) (LRB): RIGHT TOTAL KNEE ARTHROPLASTY (Right) Up with PT Drain d/c'ed Plan D/c home tomorrow Continue current care  Reginald Chambers L 12/25/2014, 8:03 AM

## 2014-12-25 NOTE — Evaluation (Signed)
Occupational Therapy One Time Evaluation Patient Details Name: Reginald Chambers MRN: [id number] DOB: 1946/09/20 Today's Date: 12/25/2014    History of Present Illness Pt is s/p RIGHT TOTAL KNEE ARTHROPLASTY    Clinical Impression   Pt is doing well. Practiced on and off [place]. Pt already able to reach down and don R sock. All education completed with pt and he has assist at home. Will sign off acute OT.     Follow Up Recommendations  No OT follow up;Supervision/Assistance - 24 hour    Equipment Recommendations  3 in 1 bedside comode    Recommendations for Other Services       Precautions / Restrictions Precautions Precautions: Knee Required Braces or Orthoses:  (Pt did SLR today) Restrictions Weight Bearing Restrictions: No      Mobility Bed Mobility                  Transfers Overall transfer level: Needs assistance Equipment used: Rolling walker (2 wheeled) Transfers: Sit to/from Stand Sit to Stand: Min guard         General transfer comment: verbal cues for hand placement.    Balance                                            ADL Overall ADL's : Needs assistance/impaired Eating/Feeding: Independent;Sitting   Grooming: Wash/dry hands;Set up;Sitting   Upper Body Bathing: Set up;Sitting   Lower Body Bathing: Min guard;Sit to/from stand   Upper Body Dressing : Set up;Sitting   Lower Body Dressing: Min guard;Sit to/from stand   Toilet Transfer: Min guard;Ambulation;BSC;RW   Toileting- Water quality scientist and Hygiene: Min guard;Sit to/from stand         General ADL Comments: Discussed tub DME options with pt and he states he is ok with sponge bathing initially till able to step over tub and let HH assess also. Pt educated on tub DME options and where to obtain if desired also. He did well reaching to R foot to don sock. Educated on sequence for LB dressing which he was pleased to learn. He would benefit from 3in1 and did  well with practicing with it.      Vision                     Perception     Praxis      Pertinent Vitals/Pain Pain Assessment: 0-10 Pain Score: 3  Pain Location: R knee Pain Descriptors / Indicators: Aching Pain Intervention(s): Repositioned;Ice applied     Hand Dominance     Extremity/Trunk Assessment Upper Extremity Assessment Upper Extremity Assessment: Overall WFL for tasks assessed           Communication Communication Communication: No difficulties   Cognition Arousal/Alertness: Awake/alert Behavior During Therapy: WFL for tasks assessed/performed Overall Cognitive Status: Within Functional Limits for tasks assessed                     General Comments       Exercises       Shoulder Instructions      Home Living Family/patient expects to be discharged to:: Private residence Living Arrangements: Spouse/significant other Available Help at Discharge: Family Type of Home: House             Bathroom Shower/Tub: Teacher, early years/pre: Standard  Home Equipment: Walker - 2 wheels;Walker - standard          Prior Functioning/Environment Level of Independence: Independent             OT Diagnosis: Generalized weakness   OT Problem List:     OT Treatment/Interventions:      OT Goals(Current goals can be found in the care plan section) Acute Rehab OT Goals Patient Stated Goal: home when ready OT Goal Formulation: With patient  OT Frequency:     Barriers to D/C:            Co-evaluation              End of Session Equipment Utilized During Treatment: Rolling walker  Activity Tolerance: Patient tolerated treatment well Patient left: in chair;with call bell/phone within reach   Time: 1128-1150 OT Time Calculation (min): 22 min Charges:  OT General Charges $OT Visit: 1 Procedure OT Evaluation $Initial OT Evaluation Tier I: 1 Procedure OT Treatments $Therapeutic Activity: 8-22  mins G-Codes:    Jules Schick  [phone] 12/25/2014, 12:18 PM

## 2014-12-25 NOTE — Progress Notes (Signed)
Utilization review completed.  

## 2014-12-25 NOTE — Progress Notes (Signed)
Physical Therapy Treatment Patient Details Name: Reginald Chambers MRN: [id number] DOB: 1946/03/22 Today's Date: 2015/01/08    History of Present Illness Pt is s/p RIGHT TOTAL KNEE ARTHROPLASTY     PT Comments    Progressing well and hopeful for d.c tomorrow  Follow Up Recommendations  Home health PT     Equipment Recommendations  None recommended by PT    Recommendations for Other Services OT consult     Precautions / Restrictions Precautions Precautions: Knee Restrictions Weight Bearing Restrictions: No Other Position/Activity Restrictions: WBAT    Mobility  Bed Mobility Overal bed mobility: Modified Independent                Transfers Overall transfer level: Needs assistance Equipment used: Rolling walker (2 wheeled) Transfers: Sit to/from Stand Sit to Stand: Min guard         General transfer comment: verbal cues for hand placement and LE management  Ambulation/Gait Ambulation/Gait assistance: Min guard;Supervision Ambulation Distance (Feet): 120 Feet Assistive device: Rolling walker (2 wheeled) Gait Pattern/deviations: Step-to pattern;Decreased step length - right;Decreased step length - left;Shuffle;Trunk flexed Gait velocity: decr   General Gait Details: cues for posture and position from [employer]            Wheelchair Mobility    Modified Rankin (Stroke Patients Only)       Balance                                    Cognition Arousal/Alertness: Awake/alert Behavior During Therapy: WFL for tasks assessed/performed Overall Cognitive Status: Within Functional Limits for tasks assessed                      Exercises Total Joint Exercises Ankle Circles/Pumps: AROM;Both;15 reps;Supine Quad Sets: AROM;Both;10 reps;Supine Heel Slides: AAROM;Right;10 reps;Supine Straight Leg Raises: AROM;Right;10 reps;Supine    General Comments        Pertinent Vitals/Pain Pain Assessment: 0-10 Pain Score: 7   Pain Location: R knee Pain Descriptors / Indicators: Aching;Sore Pain Intervention(s): Limited activity within patient's tolerance;Monitored during session;Premedicated before session;Ice applied    Home Living                      Prior Function            PT Goals (current goals can now be found in the care plan section) Acute Rehab PT Goals Patient Stated Goal: Dance again PT Goal Formulation: With patient Time For Goal Achievement: 01/01/15 Potential to Achieve Goals: Good Progress towards PT goals: Progressing toward goals    Frequency  7X/week    PT Plan Current plan remains appropriate    Co-evaluation             End of Session Equipment Utilized During Treatment: Gait belt Activity Tolerance: Patient tolerated treatment well Patient left: in bed;with call bell/phone within reach;with family/visitor present     Time: 0814-4818 PT Time Calculation (min) (ACUTE ONLY): 27 min  Charges:  $Gait Training: 8-22 mins $Therapeutic Exercise: 8-22 mins                    G Codes:      Reginald Chambers 2015/01/08, 5:16 PM

## 2014-12-26 LAB — CBC
HCT: 35.5 % — ABNORMAL LOW (ref 39.0–52.0)
Hemoglobin: 11.6 g/dL — ABNORMAL LOW (ref 13.0–17.0)
MCH: 29.7 pg (ref 26.0–34.0)
MCHC: 32.7 g/dL (ref 30.0–36.0)
MCV: 91 fL (ref 78.0–100.0)
Platelets: 156 10*3/uL (ref 150–400)
RBC: 3.9 MIL/uL — ABNORMAL LOW (ref 4.22–5.81)
RDW: 12.9 % (ref 11.5–15.5)
WBC: 8.9 10*3/uL (ref 4.0–10.5)

## 2014-12-26 MED ORDER — ONDANSETRON HCL 4 MG PO TABS: 4.0000 mg | ORAL_TABLET | Freq: Three times a day (TID) | ORAL | Status: DC | PRN

## 2014-12-26 MED ORDER — FERROUS SULFATE 325 (65 FE) MG PO TABS: 325.0000 mg | ORAL_TABLET | Freq: Three times a day (TID) | ORAL | Status: DC

## 2014-12-26 MED ORDER — TAMSULOSIN HCL 0.4 MG PO CAPS
0.4000 mg | ORAL_CAPSULE | Freq: Every day | ORAL | Status: DC
  Administered 2014-12-26 – 2014-12-27 (×2): 0.4 mg via ORAL
  Filled 2014-12-26 (×2): qty 1

## 2014-12-26 MED ORDER — ASPIRIN 325 MG PO TABS: 325.0000 mg | ORAL_TABLET | Freq: Two times a day (BID) | ORAL | Status: DC

## 2014-12-26 MED ORDER — METHOCARBAMOL 500 MG PO TABS: 500.0000 mg | ORAL_TABLET | Freq: Four times a day (QID) | ORAL | Status: DC | PRN

## 2014-12-26 MED ORDER — OXYCODONE-ACETAMINOPHEN 5-325 MG PO TABS: 1.0000 | ORAL_TABLET | ORAL | Status: DC | PRN

## 2014-12-26 NOTE — Progress Notes (Signed)
Physical Therapy Treatment Patient Details Name: Reginald Chambers MRN: [id number] DOB: 1946-11-29 Today's Date: 12/26/2014    History of Present Illness Pt is s/p RIGHT TOTAL KNEE ARTHROPLASTY     PT Comments    Pt performing therex and ambulating in hall but ltd by c/o fatigue and lightheadedness with ambulation.  Follow Up Recommendations  Home health PT     Equipment Recommendations  None recommended by PT    Recommendations for Other Services OT consult     Precautions / Restrictions Precautions Precautions: Knee Required Braces or Orthoses: Knee Immobilizer - Right Knee Immobilizer - Right: Discontinue once straight leg raise with < 10 degree lag (Pt performed IND SLR this pm) Restrictions Weight Bearing Restrictions: No Other Position/Activity Restrictions: WBAT    Mobility  Bed Mobility Overal bed mobility: Modified Independent                Transfers Overall transfer level: Needs assistance Equipment used: Rolling walker (2 wheeled) Transfers: Sit to/from Stand Sit to Stand: Min assist         General transfer comment: verbal cues for hand placement and LE management,balance loss with initial standing requiring min assist to correct  Ambulation/Gait Ambulation/Gait assistance: Min assist Ambulation Distance (Feet): 111 Feet Assistive device: Rolling walker (2 wheeled) Gait Pattern/deviations: Step-to pattern;Decreased step length - right;Decreased step length - left;Shuffle;Trunk flexed Gait velocity: decr   General Gait Details: cues for posture, stride length and position from RW.  Pt ltd by fatigue and c/o increasing lightheadedness - BP 116/68 - RN aware.  2 epidsodes mild balance loss    Stairs            Wheelchair Mobility    Modified Rankin (Stroke Patients Only)       Balance                                    Cognition Arousal/Alertness: Awake/alert Behavior During Therapy: WFL for tasks  assessed/performed Overall Cognitive Status: Within Functional Limits for tasks assessed                      Exercises Total Joint Exercises Ankle Circles/Pumps: AROM;Both;15 reps;Supine Quad Sets: AROM;Both;Supine;15 reps Heel Slides: AAROM;Right;Supine;15 reps Straight Leg Raises: AROM;Right;Supine;15 reps    General Comments        Pertinent Vitals/Pain Pain Assessment: 0-10 Pain Score: 7  Pain Location: R knee Pain Descriptors / Indicators: Aching;Sore Pain Intervention(s): Limited activity within patient's tolerance;Monitored during session;Premedicated before session;Ice applied    Home Living                      Prior Function            PT Goals (current goals can now be found in the care plan section) Acute Rehab PT Goals Patient Stated Goal: Dance again PT Goal Formulation: With patient Time For Goal Achievement: 01/01/15 Potential to Achieve Goals: Good Progress towards PT goals: Progressing toward goals    Frequency  7X/week    PT Plan Current plan remains appropriate    Co-evaluation             End of Session Equipment Utilized During Treatment: Gait belt Activity Tolerance: Patient tolerated treatment well Patient left: in chair;with call bell/phone within reach;with family/visitor present     Time: 2458-0998 PT Time Calculation (min) (ACUTE ONLY): 42 min  Charges:  $  Gait Training: 23-37 mins $Therapeutic Activity: 8-22 mins                    G Codes:      Reginald Chambers 20-Jan-2015, 4:49 PM

## 2014-12-26 NOTE — Progress Notes (Signed)
Subjective: 2 Days Post-Op Procedure(s) (LRB): RIGHT TOTAL KNEE ARTHROPLASTY (Right) Patient reports pain as moderate to right knee.  Mild nausea, but no vomiting. Progressing well with PT. Denies SOB, CP, calf pain. No F/C.  Objective: Vital signs in last 24 hours: Temp:  [97.3 F (36.3 C)-99.8 F (37.7 C)] 99 F (37.2 C) (01/28 0625) Pulse Rate:  [59-80] 59 (01/28 0625) Resp:  [14-15] 15 (01/28 0625) BP: (110-119)/(60-71) 115/65 mmHg (01/28 0625) SpO2:  [98 %] 98 % (01/28 0625)  Intake/Output from previous day: 01/27 0701 - 01/28 0700 In: 240 [P.O.:240] Out: 1400 [Urine:1400] Intake/Output this shift:     Recent Labs  12/25/14 0516 12/26/14 0531  HGB 12.5* 11.6*    Recent Labs  12/25/14 0516 12/26/14 0531  WBC 8.8 8.9  RBC 4.16* 3.90*  HCT 37.7* 35.5*  PLT 157 156    Recent Labs  12/25/14 0516  NA 137  K 4.4  CL 103  CO2 28  BUN 18  CREATININE 0.89  GLUCOSE 171*  CALCIUM 8.6   No results for input(s): LABPT, INR in the last 72 hours.  Well nourished. Alert and oriented x3. RRR, Lungs clear, BS x4. Abdomen soft and non tender. Right Calf soft and non tender. Right knee dressing C/D/I. No DVT signs. Compartment soft. No signs of infection.  Right LE neurovascular intact.  Assessment/Plan: 2 Days Post-Op Procedure(s) (LRB): RIGHT TOTAL KNEE ARTHROPLASTY (Right) Up with PT D/c Ace wrap Monitor labs Plan d/c later today or Tomorrow Continue current care  Urinary Urgency: Monitor  Add Flomax  Elimelech Houseman L 12/26/2014, 7:41 AM

## 2014-12-27 LAB — CBC
HCT: 38.1 % — ABNORMAL LOW (ref 39.0–52.0)
HEMOGLOBIN: 12.9 g/dL — AB (ref 13.0–17.0)
MCH: 30.4 pg (ref 26.0–34.0)
MCHC: 33.9 g/dL (ref 30.0–36.0)
MCV: 89.9 fL (ref 78.0–100.0)
PLATELETS: 174 10*3/uL (ref 150–400)
RBC: 4.24 MIL/uL (ref 4.22–5.81)
RDW: 12.7 % (ref 11.5–15.5)
WBC: 10.2 10*3/uL (ref 4.0–10.5)

## 2014-12-27 NOTE — Progress Notes (Signed)
Physical Therapy Treatment Patient Details Name: Reginald Chambers MRN: [id number] DOB: 11-04-1946 Today's Date: 12/27/2014    History of Present Illness Pt is s/p RIGHT TOTAL KNEE ARTHROPLASTY     PT Comments    Steady progress.  Reviewed stairs with pt and spouse.  Follow Up Recommendations  Home health PT     Equipment Recommendations  None recommended by PT    Recommendations for Other Services OT consult     Precautions / Restrictions Precautions Precautions: Knee Required Braces or Orthoses: Knee Immobilizer - Right Knee Immobilizer - Right: Discontinue once straight leg raise with < 10 degree lag (Pt performed IND SLR) Restrictions Weight Bearing Restrictions: No Other Position/Activity Restrictions: WBAT    Mobility  Bed Mobility Overal bed mobility: Modified Independent                Transfers Overall transfer level: Needs assistance Equipment used: Rolling walker (2 wheeled) Transfers: Sit to/from Stand Sit to Stand: Min guard         General transfer comment: verbal cues for hand placement and LE management,balance loss with initial standing requiring min assist to correct  Ambulation/Gait Ambulation/Gait assistance: Supervision Ambulation Distance (Feet): 150 Feet Assistive device: Rolling walker (2 wheeled) Gait Pattern/deviations: Step-to pattern;Shuffle;Trunk flexed Gait velocity: decr   General Gait Details: min cues for posture and position from RW   Stairs Stairs: Yes Stairs assistance: Min assist Stair Management: No rails;Step to pattern;Backwards;With walker Number of Stairs: 4 General stair comments: cues for sequence and foot/RW placement.  2 stairs twice with spouse assisting on 2nd attempt  Wheelchair Mobility    Modified Rankin (Stroke Patients Only)       Balance                                    Cognition Arousal/Alertness: Awake/alert Behavior During Therapy: WFL for tasks  assessed/performed Overall Cognitive Status: Within Functional Limits for tasks assessed                      Exercises Total Joint Exercises Ankle Circles/Pumps: AROM;Both;15 reps;Supine Quad Sets: AROM;Both;Supine;20 reps Heel Slides: AAROM;Right;Supine;20 reps Straight Leg Raises: AROM;Right;Supine;20 reps Long Arc Quad: AAROM;AROM;Both;Seated;10 reps Knee Flexion: AAROM;Right;10 reps;Seated    General Comments        Pertinent Vitals/Pain Pain Assessment: 0-10 Pain Score: 7  Pain Location: R knee Pain Descriptors / Indicators: Aching;Sore Pain Intervention(s): Limited activity within patient's tolerance;Monitored during session;Premedicated before session;Ice applied    Home Living                      Prior Function            PT Goals (current goals can now be found in the care plan section) Acute Rehab PT Goals Patient Stated Goal: Dance again PT Goal Formulation: With patient Time For Goal Achievement: 01/01/15 Potential to Achieve Goals: Good Progress towards PT goals: Progressing toward goals    Frequency  7X/week    PT Plan Current plan remains appropriate    Co-evaluation             End of Session Equipment Utilized During Treatment: Gait belt Activity Tolerance: Patient tolerated treatment well;Patient limited by fatigue Patient left: in bed;with call bell/phone within reach;with nursing/sitter in room;with family/visitor present     Time: 3151-7616 PT Time Calculation (min) (ACUTE ONLY): 39 min  Charges:  $  Gait Training: 23-37 mins $Therapeutic Exercise: 8-22 mins                    G Codes:      Manuel Lawhead January 10, 2015, 12:34 PM

## 2014-12-27 NOTE — Progress Notes (Signed)
Subjective: 3 Days Post-Op Procedure(s) (LRB): RIGHT TOTAL KNEE ARTHROPLASTY (Right) Patient reports pain as improving in right knee.  Tolerating PO's. Positive Flatus, but no BM. Progressing with PT. Denies SOB, CP, OR Calf pain. No N/V or F/C.  Objective: Vital signs in last 24 hours: Temp:  [98.8 F (37.1 C)-99.4 F (37.4 C)] 99.4 F (37.4 C) (01/29 0530) Pulse Rate:  [83-87] 86 (01/29 0530) Resp:  [16-18] 16 (01/29 0530) BP: (128-138)/(66-70) 138/70 mmHg (01/29 0530) SpO2:  [93 %-96 %] 95 % (01/29 0530)  Intake/Output from previous day: 01/28 0701 - 01/29 0700 In: 720 [P.O.:720] Out: -  Intake/Output this shift:     Recent Labs  12/25/14 0516 12/26/14 0531 12/27/14 0527  HGB 12.5* 11.6* 12.9*    Recent Labs  12/26/14 0531 12/27/14 0527  WBC 8.9 10.2  RBC 3.90* 4.24  HCT 35.5* 38.1*  PLT 156 174    Recent Labs  12/25/14 0516  NA 137  K 4.4  CL 103  CO2 28  BUN 18  CREATININE 0.89  GLUCOSE 171*  CALCIUM 8.6   No results for input(s): LABPT, INR in the last 72 hours.  Well nourished. Alert and oriented x3. RRR, Lungs clear, BS x4. Abdomen soft and non tender. Right Calf soft and non tender. Right knee dressing C/D/I. No DVT signs. Compartment soft. No signs of infection.  Right LE neurovascular intact.  Assessment/Plan: 3 Days Post-Op Procedure(s) (LRB): RIGHT TOTAL KNEE ARTHROPLASTY (Right) Up with PT D/C home today with Family Home PT F/U in the office in 2 weeks ASA for DVT Prophylaxis  Urinary Urgency: Improved Stop Flomax  Priseis Cratty L 12/27/2014, 7:48 AM

## 2014-12-27 NOTE — Progress Notes (Signed)
Pt alert and oriented. Pt and family educated on Lovenox injections as one or the other will be administering the medication when he gets home. Pt and spouse in understand of how to administer medication without and questions after explanation. Pt and spouse updated on process of admission as he will be going home today. Pt has equipment (BSC and walker) to take with him at discharge. Pt and spouse do not have any other questions. Pt requests to get some more rest prior to PT session and discharge.

## 2014-12-27 NOTE — Plan of Care (Signed)
Problem: Phase III Progression Outcomes Goal: Discharge plan remains appropriate-arrangements made Outcome: Completed/Met Date Met:  12/27/14 Pt to be discharged today after PT session.  Goal: Anticoagulant follow-up in place Outcome: Completed/Met Date Met:  12/27/14 Pt to be receiving Lovenox for VTE prophylaxis.      Problem: Discharge Progression Outcomes Goal: CMS/Neurovascular status at or above baseline Outcome: Completed/Met Date Met:  12/27/14 Pt alert and oriented.  Goal: Anticoagulant follow-up in place Outcome: Completed/Met Date Met:  12/27/14 Pt to be receiving Lovenox for VTE prophylaxis. Goal: Pain controlled with appropriate interventions Outcome: Completed/Met Date Met:  12/27/14 Pt taking oral pain medication and is tolerating this well.  Goal: Tolerates diet Outcome: Completed/Met Date Met:  12/27/14 Pt eating regular diet with no complications.  Goal: Ambulates safely using assistive device Outcome: Completed/Met Date Met:  12/27/14 Pt uses walker when up and moving.  Goal: Follows weight - bearing limitations Outcome: Completed/Met Date Met:  12/27/14 Pt WBAT and uses this appropriately for ambulation.  Goal: Discharge plan in place and appropriate Outcome: Completed/Met Date Met:  12/27/14 Pt to be discharged today after PT session.

## 2014-12-27 NOTE — Discharge Summary (Signed)
Physician Discharge Summary  Patient ID: Reginald Chambers MRN: [id number] DOB/AGE: [birth year] 69 y.o.  Admit date: 12/24/2014 Discharge date: 12/27/2014  Admission Diagnoses: Right knee OA  Discharge Diagnoses:  Active Problems:   S/P total knee arthroplasty   S/P total knee replacement   Discharged Condition: good  Hospital Course:  Reginald Chambers is a 69 y.o. who was admitted to [facility]. They were brought to the operating room on 12/24/2014 and underwent Procedure(s): RIGHT TOTAL KNEE ARTHROPLASTY.  Patient tolerated the procedure well and was later transferred to the recovery room and then to the orthopaedic floor for postoperative care.  They were given PO and IV analgesics for pain control following their surgery.  They were given 24 hours of postoperative antibiotics of  Anti-infectives    Start     Dose/Rate Route Frequency Ordered Stop   12/25/14 0200  vancomycin (VANCOCIN) IVPB 1000 mg/200 mL premix     1,000 mg200 mL/hr over 60 Minutes Intravenous Every 12 hours 12/24/14 1906 12/25/14 0329   12/24/14 1138  vancomycin (VANCOCIN) IVPB 1000 mg/200 mL premix     1,000 mg200 mL/hr over 60 Minutes Intravenous On call to O.R. 12/24/14 1138 12/24/14 1555     and started on DVT prophylaxis in the form of Lovenox.   PT and OT were ordered for total joint protocol.  Discharge planning consulted to help with postop disposition and equipment needs.  Patient had a good night on the evening of surgery and started to get up OOB with therapy on day one.  Hemovac drain was pulled without difficulty.  Continued to work with therapy into day two.  Dressing was WNL.  By day three, the patient had progressed with therapy and meeting their goals. Patient was seen in rounds and was ready to go home.  Consults: N/A  Significant Diagnostic Studies: Routine  Treatments: Routine  Discharge Exam: Blood pressure 138/70, pulse 86, temperature 99.4 F (37.4 C), temperature source Oral,  resp. rate 16, height 5\' 9"  (1.753 m), weight 97.523 kg (215 lb), SpO2 95 %. Well nourished. Alert and oriented x3. RRR, Lungs clear, BS x4. Abdomen soft and non tender. Right Calf soft and non tender. Right knee dressing C/D/I. No DVT signs. Compartment soft. No signs of infection.  Right LE neurovascular intact.  Disposition: 01-Home or Self Care  Discharge Instructions    Call MD / Call 911    Complete by:  As directed   If you experience chest pain or shortness of breath, CALL 911 and be transported to the hospital emergency room.  If you develope a fever above 101 F, pus (white drainage) or increased drainage or redness at the wound, or calf pain, call your surgeon's office.     Constipation Prevention    Complete by:  As directed   Drink plenty of fluids.  Prune juice may be helpful.  You may use a stool softener, such as Colace (over the counter) 100 mg twice a day.  Use MiraLax (over the counter) for constipation as needed.     Diet - low sodium heart healthy    Complete by:  As directed      Discharge instructions    Complete by:  As directed   Call and make an appointment for 2 weeks from now ([phone]). Leave dressing in place, may remove the drain site dressing on the side. Place a band aid and neosporin over the site. May shower. Start home PT as directed. Follow D/C  instructions. Take medications as directed. Weight bear as tolerated with assistive devices.     Increase activity slowly as tolerated    Complete by:  As directed             Medication List    TAKE these medications        aspirin 325 MG tablet  Take 1 tablet (325 mg total) by mouth 2 (two) times daily.     celecoxib 200 MG capsule  Commonly known as:  CELEBREX  Take 200 mg by mouth 2 (two) times daily as needed for mild pain or moderate pain.     ferrous sulfate 325 (65 FE) MG tablet  Take 1 tablet (325 mg total) by mouth 3 (three) times daily after meals.     glipiZIDE 5 MG tablet  Commonly known  as:  GLUCOTROL  Take 5 mg by mouth 2 (two) times daily before a meal.     INVOKANA 100 MG Tabs tablet  Generic drug:  canagliflozin  Take 100 mg by mouth daily.     metFORMIN 1000 MG (MOD) 24 hr tablet  Commonly known as:  GLUMETZA  Take 1,000 mg by mouth 2 (two) times daily.     methocarbamol 500 MG tablet  Commonly known as:  ROBAXIN  Take 1 tablet (500 mg total) by mouth every 6 (six) hours as needed for muscle spasms.     ondansetron 4 MG tablet  Commonly known as:  ZOFRAN  Take 1 tablet (4 mg total) by mouth every 8 (eight) hours as needed for nausea.     oxyCODONE-acetaminophen 5-325 MG per tablet  Commonly known as:  ROXICET  Take 1-2 tablets by mouth every 4 (four) hours as needed for severe pain.     simvastatin 40 MG tablet  Commonly known as:  ZOCOR  Take 40 mg by mouth at bedtime.     telmisartan-hydrochlorothiazide 40-12.5 MG per tablet  Commonly known as:  MICARDIS HCT  Take 1 tablet by mouth every morning.     TRADJENTA PO  Take 5 mg by mouth daily.           Follow-up Information    Follow up with [facility].   Why:  home health physical therapy   Contact information:   [place] [address] [phone]       Signed: Lajean Manes 12/27/2014, 7:46 AM

## 2015-04-09 ENCOUNTER — Encounter: Payer: Self-pay | Admitting: Internal Medicine

## 2015-12-19 ENCOUNTER — Encounter: Payer: Self-pay | Admitting: Cardiology

## 2016-04-14 ENCOUNTER — Encounter: Payer: Self-pay | Admitting: Cardiology

## 2016-04-14 ENCOUNTER — Ambulatory Visit (INDEPENDENT_AMBULATORY_CARE_PROVIDER_SITE_OTHER): Payer: 59 | Admitting: Cardiology

## 2016-04-14 VITALS — BP 120/74 | HR 58 | Ht 69.0 in | Wt 216.0 lb

## 2016-04-14 DIAGNOSIS — I10 Essential (primary) hypertension: Secondary | ICD-10-CM

## 2016-04-14 DIAGNOSIS — M25512 Pain in left shoulder: Secondary | ICD-10-CM

## 2016-04-14 DIAGNOSIS — E119 Type 2 diabetes mellitus without complications: Secondary | ICD-10-CM | POA: Diagnosis not present

## 2016-04-14 DIAGNOSIS — E782 Mixed hyperlipidemia: Secondary | ICD-10-CM | POA: Diagnosis not present

## 2016-04-14 NOTE — Progress Notes (Signed)
Cardiology Office Note   Date:  04/14/2016   ID:  Reginald Chambers, DOB 1946/05/18, MRN [id number]  PCP:  Glo Herring., MD  Cardiologist:   Hasini Peachey Martinique, MD   Chief Complaint  Patient presents with  . New Evaluation    Left shoulder replacement. Reestablish care.      History of Present Illness: Reginald Chambers is a 70 y.o. male who presents for pre op cardiac evaluation for left total shoulder surgery by Dr. Idelia Salm at [facility]. He has a history of DM type 2, hypercholesterolemia, and HTN. He had prior cardiac evaluation in 2005 and 2012 with normal stress Myoview studies. He does note some pain in his left shoulder that radiates into his left upper arm. This is positional and is relieved with change in position. No associated chest pain or SOB. He is active walking and playing golf. He reports his weight is stable. He had TKR in Jan 2016 without problems. Reports A1c 6.8%. BP has been well controlled.   Past Medical History  Diagnosis Date  . Type 2 diabetes mellitus ([place])   . Essential hypertension, benign   . Mixed hyperlipidemia   . Osteoarthritis   . Malaria   . 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 S/P colonoscopy May 2010    44m sessile polyp, adenoma in ascending colon, mild diverticulosis in sigmoid, internal hemorrhoids, needs repeat in 2015. Performed by Dr. GCarlean Purl . GERD (gastroesophageal reflux disease)   . Heart murmur     at birth  . Personal history of colonic polyps - adenoma 04/02/2009  . Cancer ([place]     skin cancer- basal cell CA  . History of skin cancer   . Tinnitus     SINCE 1968    Past Surgical History  Procedure Laterality Date  . Total knee arthroplasty  2007    Left knee  . Appendectomy  1959  . Rotator cuff repair  1990    Left shoulder  . Colonoscopy  04/02/09    7 mm sessile polyp ascending colon/internal hemmorrhoids/mild diverticulosis sigmoid colon  . Bilateral knee arthroscopy    . Right shoulder  arthroscopy    . Total knee arthroplasty Right 12/24/2014    Procedure: RIGHT TOTAL KNEE ARTHROPLASTY;  Surgeon: RSydnee Cabal MD;  Location: [facility];  Service: Orthopedics;  Laterality: Right;     Current Outpatient Prescriptions  Medication Sig Dispense Refill  . aspirin 81 MG tablet Take 81 mg by mouth 2 (two) times daily.    . Canagliflozin (INVOKANA) 100 MG TABS Take 300 mg by mouth daily.     .Marland KitchenglipiZIDE (GLUCOTROL) 5 MG tablet Take 5 mg by mouth 2 (two) times daily before a meal.      . metFORMIN (GLUMETZA) 1000 MG (MOD) 24 hr tablet Take 1,000 mg by mouth daily with breakfast.     . simvastatin (ZOCOR) 40 MG tablet Take 40 mg by mouth at bedtime.     .Marland Kitchentelmisartan-hydrochlorothiazide (MICARDIS HCT) 40-12.5 MG per tablet Take 1 tablet by mouth every morning.     . vitamin B-12 (CYANOCOBALAMIN) 500 MCG tablet Take 500 mcg by mouth 2 (two) times daily.     No current facility-administered medications for this visit.    Allergies:   Penicillins    Social History:  The patient  reports that he quit smoking about 25 years ago. His smoking use included Cigarettes. He has a 5 pack-year smoking history. He  has never used smokeless tobacco. He reports that he drinks alcohol. He reports that he does not use illicit drugs.   Family History:  The patient's family history includes Diabetes type II in his mother; Heart failure in his mother; Lymphoma in his father. There is no history of Colon cancer, Esophageal cancer, Stomach cancer, or Rectal cancer.    ROS:  Please see the history of present illness.   Otherwise, review of systems are positive for none.   All other systems are reviewed and negative.    PHYSICAL EXAM: VS:  BP 120/74 mmHg  Pulse 58  Ht 5' 9"  (1.753 m)  Wt 97.977 kg (216 lb)  BMI 31.88 kg/m2 , BMI Body mass index is 31.88 kg/(m^2). GEN: Well nourished, obese, in no acute distress HEENT: normal Neck: no JVD, carotid bruits, or masses Cardiac: RRR; no murmurs, rubs, or  gallops,no edema. Pedal pulses 2+ Respiratory:  clear to auscultation bilaterally, normal work of breathing GI: soft, nontender, nondistended, + BS MS: no deformity or atrophy. Old surgical scars on knees.  Skin: warm and dry, no rash, no edema.  Neuro:  Strength and sensation are intact Psych: euthymic mood, full affect   EKG:  EKG is ordered today. The ekg ordered today demonstrates Sinus brady with rate 58. Normal Ecg. I have personally reviewed and interpreted this study.    Recent Labs: No results found for requested labs within last 365 days.    Lipid Panel No results found for: CHOL, TRIG, HDL, CHOLHDL, VLDL, LDLCALC, LDLDIRECT    Wt Readings from Last 3 Encounters:  04/14/16 97.977 kg (216 lb)  12/24/14 97.523 kg (215 lb)  12/11/14 97.523 kg (215 lb)      Other studies Reviewed: Additional studies/ records that were reviewed today include: old stress Myoview studies from 2005 and 2012.[name] Review of the above records demonstrates: Normal perfusion. EF 56%.    ASSESSMENT AND PLAN:  1.  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 I believe his current shoulder pain is musculoskeletal. No anginal symptoms. He is able to perform at >7 met level without difficulty. Prior stress tests have been normal. He is on appropriate therapy for his risk factors. I think he is low to modest risk for shoulder surgery. I do not think that further cardiac work up is necessary and have cleared him to proceed with surgery  2. HTN controlled.  3. DM type 2  4. Hyperlipidemia. On statin therapy  5. Obesity.    Current medicines are reviewed at length with the patient today.  The patient does not have concerns regarding medicines.  The following changes have been made:  no change  Labs/ tests ordered today include:  Orders Placed This Encounter  Procedures  . EKG 12-Lead     Disposition:   FU PRN  Signed, Valita Righter Martinique, MD  04/14/2016 10:00 AM    [facility] [address] [place], [place], [address] Phone [phone], Fax [phone]

## 2016-04-14 NOTE — Patient Instructions (Signed)
You are cleared to proceed with shoulder surgery.  I will see you as needed.

## 2016-10-12 DIAGNOSIS — M1991 Primary osteoarthritis, unspecified site: Secondary | ICD-10-CM | POA: Diagnosis not present

## 2016-10-12 DIAGNOSIS — Z0001 Encounter for general adult medical examination with abnormal findings: Secondary | ICD-10-CM | POA: Diagnosis not present

## 2016-10-12 DIAGNOSIS — E6609 Other obesity due to excess calories: Secondary | ICD-10-CM | POA: Diagnosis not present

## 2016-10-12 DIAGNOSIS — Z1389 Encounter for screening for other disorder: Secondary | ICD-10-CM | POA: Diagnosis not present

## 2016-10-12 DIAGNOSIS — Z23 Encounter for immunization: Secondary | ICD-10-CM | POA: Diagnosis not present

## 2016-10-12 DIAGNOSIS — E1165 Type 2 diabetes mellitus with hyperglycemia: Secondary | ICD-10-CM | POA: Diagnosis not present

## 2016-10-12 DIAGNOSIS — Z6830 Body mass index (BMI) 30.0-30.9, adult: Secondary | ICD-10-CM | POA: Diagnosis not present

## 2016-10-12 DIAGNOSIS — K219 Gastro-esophageal reflux disease without esophagitis: Secondary | ICD-10-CM | POA: Diagnosis not present

## 2016-10-14 DIAGNOSIS — E11319 Type 2 diabetes mellitus with unspecified diabetic retinopathy without macular edema: Secondary | ICD-10-CM | POA: Diagnosis not present

## 2016-10-26 ENCOUNTER — Encounter (HOSPITAL_COMMUNITY): Payer: Self-pay

## 2016-10-26 NOTE — Pre-Procedure Instructions (Signed)
LEIBY STANTZ  10/26/2016      [id number] - [place], [place] - [id number] [place] [address] Phone: [phone] Fax: [phone]  [id number] - [place], [place] [address] [place] [address] Phone: [phone] Fax: [phone]    Your procedure is scheduled on Thursday, December 7th, 2017.  Report to [facility] Admitting at 8:00 A.M.   Call this number if you have problems the morning of surgery:  [phone]   Remember:  Do not eat food or drink liquids after midnight.   Take these medicines the morning of surgery with A SIP OF WATER: None.  7 days prior to surgery, stop taking: Aspirin, NSAIDS, Aleve, Naproxen, Ibuprofen, Advil, Motrin, BC's, Goody's, Fish oil, all herbal medications, and all vitamins.     WHAT DO I DO ABOUT MY DIABETES MEDICATION?  [name] Do not take oral diabetes medicines (pills) the morning of surgery.  Do NOT take Glipizide or Metformin the morning of surgery.    . THE NIGHT BEFORE SURGERY, do NOT take evening dose of Glipizide (Glucotrol).   How to Manage Your Diabetes Before and After Surgery  Why is it important to control my blood sugar before and after surgery? . Improving blood sugar levels before and after surgery helps healing and can limit problems. . A way of improving blood sugar control is eating a healthy diet by: o  Eating less sugar and carbohydrates o  Increasing activity/exercise o  Talking with your doctor about reaching your blood sugar goals . High blood sugars (greater than 180 mg/dL) can raise your risk of infections and slow your recovery, so you will need to focus on controlling your diabetes during the weeks before surgery. . Make sure that the doctor who takes care of your diabetes knows about your planned surgery including the date and location.  How do I manage my blood sugar before  surgery? . Check your blood sugar at least 4 times a day, starting 2 days before surgery, to make sure that the level is not too high or low. o Check your blood sugar the morning of your surgery when you wake up and every 2 hours until you get to the Short Stay unit. . If your blood sugar is less than 70 mg/dL, you will need to treat for low blood sugar: o Do not take insulin. o Treat a low blood sugar (less than 70 mg/dL) with  cup of clear juice (cranberry or apple), 4 glucose tablets, OR glucose gel. o Recheck blood sugar in 15 minutes after treatment (to make sure it is greater than 70 mg/dL). If your blood sugar is not greater than 70 mg/dL on recheck, call [phone] for further instructions. . Report your blood sugar to the short stay nurse when you get to Short Stay.  . If you are admitted to the hospital after surgery: o Your blood sugar will be checked by the staff and you will probably be given insulin after surgery (instead of oral diabetes medicines) to make sure you have good blood sugar levels. o The goal for blood sugar control after surgery is 80-180 mg/dL.    Do not wear jewelry.  Do not wear lotions, powders, or colognes, or deoderant.  Men may shave face and neck.  Do not bring valuables to the hospital.  [facility] is not responsible for any  belongings or valuables.  Contacts, dentures or bridgework may not be worn into surgery.  Leave your suitcase in the car.  After surgery it may be brought to your room.  For patients admitted to the hospital, discharge time will be determined by your treatment team.  Patients discharged the day of surgery will not be allowed to drive home.   Special instructions:  Preparing for Surgery.   [facility]- Preparing For Surgery  Before surgery, you can play an important role. Because skin is not sterile, your skin needs to be as free of germs as possible. You can reduce the number of germs on your skin by washing with CHG  (chlorahexidine gluconate) Soap before surgery.  CHG is an antiseptic cleaner which kills germs and bonds with the skin to continue killing germs even after washing.  Please do not use if you have an allergy to CHG or antibacterial soaps. If your skin becomes reddened/irritated stop using the CHG.  Do not shave (including legs and underarms) for at least 48 hours prior to first CHG shower. It is OK to shave your face.  Please follow these instructions carefully.   1. Shower the NIGHT BEFORE SURGERY and the MORNING OF SURGERY with CHG.   2. If you chose to wash your hair, wash your hair first as usual with your normal shampoo.  3. After you shampoo, rinse your hair and body thoroughly to remove the shampoo.  4. Use CHG as you would any other liquid soap. You can apply CHG directly to the skin and wash gently with a scrungie or a clean washcloth.   5. Apply the CHG Soap to your body ONLY FROM THE NECK DOWN.  Do not use on open wounds or open sores. Avoid contact with your eyes, ears, mouth and genitals (private parts). Wash genitals (private parts) with your normal soap.  6. Wash thoroughly, paying special attention to the area where your surgery will be performed.  7. Thoroughly rinse your body with warm water from the neck down.  8. DO NOT shower/wash with your normal soap after using and rinsing off the CHG Soap.  9. Pat yourself dry with a CLEAN TOWEL.   10. Wear CLEAN PAJAMAS   11. Place CLEAN SHEETS on your bed the night of your first shower and DO NOT SLEEP WITH PETS.   Day of Surgery: Do not apply any deodorants/lotions. Please wear clean clothes to the hospital/surgery center.     Please read over the following fact sheets that you were given. MRSA Information

## 2016-10-27 ENCOUNTER — Encounter (HOSPITAL_COMMUNITY)
Admission: RE | Admit: 2016-10-27 | Discharge: 2016-10-27 | Disposition: A | Discharge status: Home / Self Care | Payer: 59 | Source: Ambulatory Visit | Attending: Orthopedic Surgery | Admitting: Orthopedic Surgery

## 2016-10-27 ENCOUNTER — Encounter (HOSPITAL_COMMUNITY): Payer: Self-pay

## 2016-10-27 DIAGNOSIS — Z01818 Encounter for other preprocedural examination: Secondary | ICD-10-CM | POA: Insufficient documentation

## 2016-10-27 DIAGNOSIS — M75102 Unspecified rotator cuff tear or rupture of left shoulder, not specified as traumatic: Secondary | ICD-10-CM | POA: Diagnosis not present

## 2016-10-27 HISTORY — DX: Unspecified hearing loss, unspecified ear: H91.90

## 2016-10-27 HISTORY — DX: Nocturia: R35.1

## 2016-10-27 LAB — CBC
HCT: 40.7 % (ref 39.0–52.0)
Hemoglobin: 13.7 g/dL (ref 13.0–17.0)
MCH: 30.2 pg (ref 26.0–34.0)
MCHC: 33.7 g/dL (ref 30.0–36.0)
MCV: 89.8 fL (ref 78.0–100.0)
PLATELETS: 193 10*3/uL (ref 150–400)
RBC: 4.53 MIL/uL (ref 4.22–5.81)
RDW: 12.7 % (ref 11.5–15.5)
WBC: 6.5 10*3/uL (ref 4.0–10.5)

## 2016-10-27 LAB — COMPREHENSIVE METABOLIC PANEL
ALT: 37 U/L (ref 17–63)
AST: 30 U/L (ref 15–41)
Albumin: 4 g/dL (ref 3.5–5.0)
Alkaline Phosphatase: 61 U/L (ref 38–126)
Anion gap: 12 (ref 5–15)
BUN: 21 mg/dL — AB (ref 6–20)
CHLORIDE: 101 mmol/L (ref 101–111)
CO2: 25 mmol/L (ref 22–32)
Calcium: 9.7 mg/dL (ref 8.9–10.3)
Creatinine, Ser: 0.99 mg/dL (ref 0.61–1.24)
Glucose, Bld: 219 mg/dL — ABNORMAL HIGH (ref 65–99)
POTASSIUM: 4.2 mmol/L (ref 3.5–5.1)
SODIUM: 138 mmol/L (ref 135–145)
Total Bilirubin: 0.6 mg/dL (ref 0.3–1.2)
Total Protein: 7.1 g/dL (ref 6.5–8.1)

## 2016-10-27 LAB — SURGICAL PCR SCREEN
MRSA, PCR: NEGATIVE
Staphylococcus aureus: NEGATIVE

## 2016-10-27 LAB — GLUCOSE, CAPILLARY: GLUCOSE-CAPILLARY: 202 mg/dL — AB (ref 65–99)

## 2016-10-27 NOTE — Progress Notes (Signed)
PCP - Dr. Redmond School Cardiologist - Dr. Peter Martinique  (clearance note 04/14/16) Patient also goes to [facility]  EKG - 04/14/16 CXR - denies Echo-denies Stress test - 2012 Cardiac Cath - denies  Patient denies chest pain and shortness of breath at PAT appointment.  Patient informed nurse that he checks his blood sugar twice a month at at home and that he recently had an A1C drawn at Dr. Nolon Rod office.  Patient stated that his recent A1C was 8.6  (records requested by nurse).

## 2016-10-28 NOTE — Progress Notes (Signed)
Anesthesia Chart Review:  Pt is a 70 year old male scheduled for L reverse shoulder arthroplasty on 11/04/2016 with Justice Britain, MD.   - PCP is Redmond School, MD.   PMH includes:  HTN, hyperlipidemia, DM, heart murmur (at birth), GERD. Hard of hearing. Former smoker. BMI 31.5. S/p R TKA 12/24/14.   Medications include: ASA, canagliflozin, glipizide, metformin, simvastatin, telmisartan-hctz  Preoperative labs reviewed.  Glucose 219.  HgbA1c was 8.6 on 10/12/16 at PCP's office.   EKG 04/14/16: sinus bradycardia (58 bpm)  Nuclear stress test 02/26/11: Normal stress nuclear myocardial study revealing adequate exercise tolerance, borderline left ventricular dilatation with preserved systolic function and perhaps a small segmental wall motion abnormality, a negative stress EKG and normal myocardial perfusion without evidence for ischemia or infarction.   Pt saw Peter Martinique, MD with cardiology 04/14/16 for pre-op eval for L shoulder arthroplasty with different surgeon.  Pt cleared for surgery, f/u prn recommended.  Surgery did not take place, pt now scheduled with Dr. Onnie Graham here at [facility].    If no changes, I anticipate pt can proceed with surgery as scheduled.   Willeen Cass, FNP-BC [facility] Short Stay Surgical Center/Anesthesiology Phone: [phone] 10/28/2016 4:03 PM

## 2016-11-03 MED ORDER — TRANEXAMIC ACID 1000 MG/10ML IV SOLN
1000.0000 mg | INTRAVENOUS | Status: AC
  Administered 2016-11-04: 1000 mg via INTRAVENOUS
  Filled 2016-11-03 (×2): qty 10

## 2016-11-04 ENCOUNTER — Ambulatory Visit (HOSPITAL_COMMUNITY): Payer: PPO | Admitting: Certified Registered Nurse Anesthetist

## 2016-11-04 ENCOUNTER — Encounter (HOSPITAL_COMMUNITY): Payer: Self-pay | Admitting: Certified Registered Nurse Anesthetist

## 2016-11-04 ENCOUNTER — Ambulatory Visit (HOSPITAL_COMMUNITY): Payer: PPO | Admitting: Emergency Medicine

## 2016-11-04 ENCOUNTER — Encounter (HOSPITAL_COMMUNITY)
Admission: RE | Disposition: A | Discharge status: Home / Self Care | Payer: Self-pay | Source: Ambulatory Visit | Attending: Orthopedic Surgery

## 2016-11-04 ENCOUNTER — Observation Stay (HOSPITAL_COMMUNITY)
Admission: RE | Admit: 2016-11-04 | Discharge: 2016-11-05 | Disposition: A | Discharge status: Home / Self Care | Payer: PPO | Source: Ambulatory Visit | Attending: Orthopedic Surgery | Admitting: Orthopedic Surgery

## 2016-11-04 DIAGNOSIS — Z7984 Long term (current) use of oral hypoglycemic drugs: Secondary | ICD-10-CM | POA: Insufficient documentation

## 2016-11-04 DIAGNOSIS — E782 Mixed hyperlipidemia: Secondary | ICD-10-CM | POA: Diagnosis not present

## 2016-11-04 DIAGNOSIS — E119 Type 2 diabetes mellitus without complications: Secondary | ICD-10-CM | POA: Insufficient documentation

## 2016-11-04 DIAGNOSIS — Z85828 Personal history of other malignant neoplasm of skin: Secondary | ICD-10-CM | POA: Insufficient documentation

## 2016-11-04 DIAGNOSIS — M75102 Unspecified rotator cuff tear or rupture of left shoulder, not specified as traumatic: Secondary | ICD-10-CM | POA: Diagnosis not present

## 2016-11-04 DIAGNOSIS — M19012 Primary osteoarthritis, left shoulder: Secondary | ICD-10-CM | POA: Diagnosis not present

## 2016-11-04 DIAGNOSIS — M1990 Unspecified osteoarthritis, unspecified site: Secondary | ICD-10-CM | POA: Diagnosis not present

## 2016-11-04 DIAGNOSIS — K219 Gastro-esophageal reflux disease without esophagitis: Secondary | ICD-10-CM | POA: Insufficient documentation

## 2016-11-04 DIAGNOSIS — G8918 Other acute postprocedural pain: Secondary | ICD-10-CM | POA: Diagnosis not present

## 2016-11-04 DIAGNOSIS — Z96653 Presence of artificial knee joint, bilateral: Secondary | ICD-10-CM | POA: Diagnosis not present

## 2016-11-04 DIAGNOSIS — Z7982 Long term (current) use of aspirin: Secondary | ICD-10-CM | POA: Diagnosis not present

## 2016-11-04 DIAGNOSIS — I10 Essential (primary) hypertension: Secondary | ICD-10-CM | POA: Diagnosis not present

## 2016-11-04 DIAGNOSIS — Z87891 Personal history of nicotine dependence: Secondary | ICD-10-CM | POA: Diagnosis not present

## 2016-11-04 DIAGNOSIS — Z96612 Presence of left artificial shoulder joint: Secondary | ICD-10-CM

## 2016-11-04 HISTORY — PX: REVERSE SHOULDER ARTHROPLASTY: SHX5054

## 2016-11-04 LAB — GLUCOSE, CAPILLARY
GLUCOSE-CAPILLARY: 205 mg/dL — AB (ref 65–99)
GLUCOSE-CAPILLARY: 184 mg/dL — AB (ref 65–99)
GLUCOSE-CAPILLARY: 198 mg/dL — AB (ref 65–99)
GLUCOSE-CAPILLARY: 253 mg/dL — AB (ref 65–99)

## 2016-11-04 SURGERY — ARTHROPLASTY, SHOULDER, TOTAL, REVERSE
Anesthesia: General | Laterality: Left

## 2016-11-04 MED ORDER — ROCURONIUM BROMIDE 10 MG/ML (PF) SYRINGE
PREFILLED_SYRINGE | INTRAVENOUS | Status: AC
  Filled 2016-11-04: qty 10

## 2016-11-04 MED ORDER — ACETAMINOPHEN 650 MG RE SUPP: 650.0000 mg | Freq: Four times a day (QID) | RECTAL | Status: DC | PRN

## 2016-11-04 MED ORDER — SIMVASTATIN 40 MG PO TABS
40.0000 mg | ORAL_TABLET | Freq: Every day | ORAL | Status: DC
  Administered 2016-11-04: 40 mg via ORAL
  Filled 2016-11-04: qty 1

## 2016-11-04 MED ORDER — GLYCOPYRROLATE 0.2 MG/ML IV SOSY
PREFILLED_SYRINGE | INTRAVENOUS | Status: AC
  Filled 2016-11-04: qty 3

## 2016-11-04 MED ORDER — ACETAMINOPHEN 325 MG PO TABS: 650.0000 mg | ORAL_TABLET | Freq: Four times a day (QID) | ORAL | Status: DC | PRN

## 2016-11-04 MED ORDER — HYDROMORPHONE HCL 1 MG/ML IJ SOLN: 0.2500 mg | INTRAMUSCULAR | Status: DC | PRN

## 2016-11-04 MED ORDER — ONDANSETRON HCL 4 MG PO TABS: 4.0000 mg | ORAL_TABLET | Freq: Four times a day (QID) | ORAL | Status: DC | PRN

## 2016-11-04 MED ORDER — ALUM & MAG HYDROXIDE-SIMETH 200-200-20 MG/5ML PO SUSP: 30.0000 mL | ORAL | Status: DC | PRN

## 2016-11-04 MED ORDER — ONDANSETRON HCL 4 MG/2ML IJ SOLN
INTRAMUSCULAR | Status: DC | PRN
  Administered 2016-11-04: 4 mg via INTRAVENOUS

## 2016-11-04 MED ORDER — SUGAMMADEX SODIUM 200 MG/2ML IV SOLN
INTRAVENOUS | Status: DC | PRN
  Administered 2016-11-04: 200 mg via INTRAVENOUS

## 2016-11-04 MED ORDER — MAGNESIUM CITRATE PO SOLN: 1.0000 | Freq: Once | ORAL | Status: DC | PRN

## 2016-11-04 MED ORDER — PROPOFOL 10 MG/ML IV BOLUS
INTRAVENOUS | Status: AC
  Filled 2016-11-04: qty 20

## 2016-11-04 MED ORDER — DIAZEPAM 5 MG PO TABS: 5.0000 mg | ORAL_TABLET | Freq: Four times a day (QID) | ORAL | Status: DC | PRN

## 2016-11-04 MED ORDER — FENTANYL CITRATE (PF) 100 MCG/2ML IJ SOLN
INTRAMUSCULAR | Status: DC | PRN
  Administered 2016-11-04 (×3): 50 ug via INTRAVENOUS

## 2016-11-04 MED ORDER — KETOROLAC TROMETHAMINE 15 MG/ML IJ SOLN
7.5000 mg | Freq: Four times a day (QID) | INTRAMUSCULAR | Status: AC
  Administered 2016-11-04 – 2016-11-05 (×4): 7.5 mg via INTRAVENOUS
  Filled 2016-11-04 (×3): qty 1

## 2016-11-04 MED ORDER — DOCUSATE SODIUM 100 MG PO CAPS
100.0000 mg | ORAL_CAPSULE | Freq: Two times a day (BID) | ORAL | Status: DC
  Administered 2016-11-04 – 2016-11-05 (×3): 100 mg via ORAL
  Filled 2016-11-04 (×3): qty 1

## 2016-11-04 MED ORDER — OXYCODONE HCL 5 MG PO TABS: 5.0000 mg | ORAL_TABLET | Freq: Once | ORAL | Status: DC | PRN

## 2016-11-04 MED ORDER — HYDROMORPHONE HCL 2 MG/ML IJ SOLN: 1.0000 mg | INTRAMUSCULAR | Status: DC | PRN

## 2016-11-04 MED ORDER — HYDROCHLOROTHIAZIDE 12.5 MG PO CAPS
12.5000 mg | ORAL_CAPSULE | Freq: Every day | ORAL | Status: DC
  Administered 2016-11-04 – 2016-11-05 (×2): 12.5 mg via ORAL
  Filled 2016-11-04 (×2): qty 1

## 2016-11-04 MED ORDER — PROPOFOL 10 MG/ML IV BOLUS
INTRAVENOUS | Status: DC | PRN
  Administered 2016-11-04: 140 mg via INTRAVENOUS
  Administered 2016-11-04: 10 mg via INTRAVENOUS

## 2016-11-04 MED ORDER — IRBESARTAN 150 MG PO TABS
150.0000 mg | ORAL_TABLET | Freq: Every day | ORAL | Status: DC
  Administered 2016-11-04 – 2016-11-05 (×2): 150 mg via ORAL
  Filled 2016-11-04 (×2): qty 1

## 2016-11-04 MED ORDER — TELMISARTAN-HCTZ 40-12.5 MG PO TABS: 1.0000 | ORAL_TABLET | Freq: Every morning | ORAL | Status: DC

## 2016-11-04 MED ORDER — MENTHOL 3 MG MT LOZG: 1.0000 | LOZENGE | OROMUCOSAL | Status: DC | PRN

## 2016-11-04 MED ORDER — PHENYLEPHRINE HCL 10 MG/ML IJ SOLN
INTRAVENOUS | Status: DC | PRN
  Administered 2016-11-04: 30 ug/min via INTRAVENOUS

## 2016-11-04 MED ORDER — ONDANSETRON HCL 4 MG/2ML IJ SOLN: 4.0000 mg | Freq: Four times a day (QID) | INTRAMUSCULAR | Status: DC | PRN

## 2016-11-04 MED ORDER — PHENOL 1.4 % MT LIQD: 1.0000 | OROMUCOSAL | Status: DC | PRN

## 2016-11-04 MED ORDER — LACTATED RINGERS IV SOLN
INTRAVENOUS | Status: DC
  Administered 2016-11-04: 14:00:00 via INTRAVENOUS

## 2016-11-04 MED ORDER — FENTANYL CITRATE (PF) 100 MCG/2ML IJ SOLN
INTRAMUSCULAR | Status: AC
  Filled 2016-11-04: qty 2

## 2016-11-04 MED ORDER — INSULIN ASPART 100 UNIT/ML ~~LOC~~ SOLN
0.0000 [IU] | Freq: Three times a day (TID) | SUBCUTANEOUS | Status: DC
  Administered 2016-11-04: 3 [IU] via SUBCUTANEOUS
  Administered 2016-11-05: 5 [IU] via SUBCUTANEOUS

## 2016-11-04 MED ORDER — METOCLOPRAMIDE HCL 5 MG/ML IJ SOLN: 5.0000 mg | Freq: Three times a day (TID) | INTRAMUSCULAR | Status: DC | PRN

## 2016-11-04 MED ORDER — SODIUM CHLORIDE 0.9 % IR SOLN
Status: DC | PRN
  Administered 2016-11-04: 1000 mL

## 2016-11-04 MED ORDER — BUPIVACAINE-EPINEPHRINE (PF) 0.5% -1:200000 IJ SOLN
INTRAMUSCULAR | Status: DC | PRN
  Administered 2016-11-04: 30 mL via PERINEURAL

## 2016-11-04 MED ORDER — LACTATED RINGERS IV SOLN
INTRAVENOUS | Status: DC
  Administered 2016-11-04: 09:00:00 via INTRAVENOUS

## 2016-11-04 MED ORDER — LIDOCAINE 2% (20 MG/ML) 5 ML SYRINGE
INTRAMUSCULAR | Status: AC
  Filled 2016-11-04: qty 5

## 2016-11-04 MED ORDER — ONDANSETRON HCL 4 MG/2ML IJ SOLN
INTRAMUSCULAR | Status: AC
  Filled 2016-11-04: qty 2

## 2016-11-04 MED ORDER — LACTATED RINGERS IV SOLN
INTRAVENOUS | Status: DC | PRN
  Administered 2016-11-04 (×2): via INTRAVENOUS

## 2016-11-04 MED ORDER — MIDAZOLAM HCL 2 MG/2ML IJ SOLN
INTRAMUSCULAR | Status: AC
  Filled 2016-11-04: qty 2

## 2016-11-04 MED ORDER — CEFAZOLIN SODIUM-DEXTROSE 2-4 GM/100ML-% IV SOLN
2.0000 g | Freq: Four times a day (QID) | INTRAVENOUS | Status: AC
  Administered 2016-11-04 – 2016-11-05 (×3): 2 g via INTRAVENOUS
  Filled 2016-11-04 (×4): qty 100

## 2016-11-04 MED ORDER — KETOROLAC TROMETHAMINE 15 MG/ML IJ SOLN
INTRAMUSCULAR | Status: AC
  Administered 2016-11-04: 7.5 mg via INTRAVENOUS
  Filled 2016-11-04: qty 1

## 2016-11-04 MED ORDER — GLYCOPYRROLATE 0.2 MG/ML IJ SOLN
INTRAMUSCULAR | Status: DC | PRN
  Administered 2016-11-04: 0.2 mg via INTRAVENOUS

## 2016-11-04 MED ORDER — OXYCODONE HCL 5 MG/5ML PO SOLN: 5.0000 mg | Freq: Once | ORAL | Status: DC | PRN

## 2016-11-04 MED ORDER — SUGAMMADEX SODIUM 200 MG/2ML IV SOLN
INTRAVENOUS | Status: AC
  Filled 2016-11-04: qty 2

## 2016-11-04 MED ORDER — ACETAMINOPHEN 160 MG/5ML PO SOLN
325.0000 mg | ORAL | Status: DC | PRN
  Filled 2016-11-04: qty 20.3

## 2016-11-04 MED ORDER — BISACODYL 5 MG PO TBEC: 5.0000 mg | DELAYED_RELEASE_TABLET | Freq: Every day | ORAL | Status: DC | PRN

## 2016-11-04 MED ORDER — CEFAZOLIN SODIUM-DEXTROSE 2-4 GM/100ML-% IV SOLN
INTRAVENOUS | Status: AC
  Filled 2016-11-04: qty 100

## 2016-11-04 MED ORDER — ACETAMINOPHEN 325 MG PO TABS: 325.0000 mg | ORAL_TABLET | ORAL | Status: DC | PRN

## 2016-11-04 MED ORDER — ROCURONIUM BROMIDE 10 MG/ML (PF) SYRINGE
PREFILLED_SYRINGE | INTRAVENOUS | Status: DC | PRN
  Administered 2016-11-04: 40 mg via INTRAVENOUS

## 2016-11-04 MED ORDER — POLYETHYLENE GLYCOL 3350 17 G PO PACK: 17.0000 g | PACK | Freq: Every day | ORAL | Status: DC | PRN

## 2016-11-04 MED ORDER — ASPIRIN EC 81 MG PO TBEC
81.0000 mg | DELAYED_RELEASE_TABLET | Freq: Two times a day (BID) | ORAL | Status: DC
  Administered 2016-11-04 – 2016-11-05 (×3): 81 mg via ORAL
  Filled 2016-11-04 (×3): qty 1

## 2016-11-04 MED ORDER — METOCLOPRAMIDE HCL 5 MG PO TABS: 5.0000 mg | ORAL_TABLET | Freq: Three times a day (TID) | ORAL | Status: DC | PRN

## 2016-11-04 MED ORDER — CHLORHEXIDINE GLUCONATE 4 % EX LIQD: 60.0000 mL | Freq: Once | CUTANEOUS | Status: DC

## 2016-11-04 MED ORDER — CEFAZOLIN SODIUM-DEXTROSE 2-4 GM/100ML-% IV SOLN
2.0000 g | INTRAVENOUS | Status: AC
  Administered 2016-11-04: 2 g via INTRAVENOUS

## 2016-11-04 MED ORDER — FENTANYL CITRATE (PF) 100 MCG/2ML IJ SOLN
INTRAMUSCULAR | Status: AC
  Filled 2016-11-04: qty 4

## 2016-11-04 MED ORDER — OXYCODONE HCL 5 MG PO TABS
5.0000 mg | ORAL_TABLET | ORAL | Status: DC | PRN
  Administered 2016-11-04 – 2016-11-05 (×3): 10 mg via ORAL
  Filled 2016-11-04 (×3): qty 2

## 2016-11-04 SURGICAL SUPPLY — 69 items
ADH SKN CLS APL DERMABOND .7 (GAUZE/BANDAGES/DRESSINGS) ×1
AID PSTN UNV HD RSTRNT DISP (MISCELLANEOUS) ×1
BASEPLATE GLENOID SHLDR SM (Shoulder) ×2 IMPLANT
BLADE SAW SGTL 83.5X18.5 (BLADE) ×2 IMPLANT
BSPLAT GLND SM PRFT SHLDR CA (Shoulder) ×1 IMPLANT
COVER SURGICAL LIGHT HANDLE (MISCELLANEOUS) ×2 IMPLANT
CUP SUT UNIV REVERS 39 NEU (Shoulder) ×1 IMPLANT
DERMABOND ADVANCED (GAUZE/BANDAGES/DRESSINGS) ×1
DERMABOND ADVANCED .7 DNX12 (GAUZE/BANDAGES/DRESSINGS) ×1 IMPLANT
DRAPE ORTHO SPLIT 77X108 STRL (DRAPES) ×4
DRAPE SURG 17X11 SM STRL (DRAPES) ×2 IMPLANT
DRAPE SURG ORHT 6 SPLT 77X108 (DRAPES) ×2 IMPLANT
DRAPE U-SHAPE 47X51 STRL (DRAPES) ×2 IMPLANT
DRESSING AQUACEL AG SP 3.5X6 (GAUZE/BANDAGES/DRESSINGS) IMPLANT
DRSG AQUACEL AG ADV 3.5X10 (GAUZE/BANDAGES/DRESSINGS) ×2 IMPLANT
DRSG AQUACEL AG SP 3.5X6 (GAUZE/BANDAGES/DRESSINGS) ×2
DURAPREP 26ML APPLICATOR (WOUND CARE) ×2 IMPLANT
ELECT BLADE 4.0 EZ CLEAN MEGAD (MISCELLANEOUS) ×2
ELECT CAUTERY BLADE 6.4 (BLADE) ×2 IMPLANT
ELECT REM PT RETURN 9FT ADLT (ELECTROSURGICAL) ×2
ELECTRODE BLDE 4.0 EZ CLN MEGD (MISCELLANEOUS) ×1 IMPLANT
ELECTRODE REM PT RTRN 9FT ADLT (ELECTROSURGICAL) ×1 IMPLANT
FACESHIELD WRAPAROUND (MASK) ×6 IMPLANT
FACESHIELD WRAPAROUND OR TEAM (MASK) ×3 IMPLANT
GLENOSPHERE LAT 39+4 SHOULDER (Shoulder) ×1 IMPLANT
GLOVE BIO SURGEON STRL SZ7.5 (GLOVE) ×2 IMPLANT
GLOVE BIO SURGEON STRL SZ8 (GLOVE) ×2 IMPLANT
GLOVE EUDERMIC 7 POWDERFREE (GLOVE) ×2 IMPLANT
GLOVE SS BIOGEL STRL SZ 7.5 (GLOVE) ×1 IMPLANT
GLOVE SUPERSENSE BIOGEL SZ 7.5 (GLOVE) ×1
GLOVE SURG SS PI 6.5 STRL IVOR (GLOVE) ×1 IMPLANT
GLOVE SURG SS PI 8.0 STRL IVOR (GLOVE) ×2 IMPLANT
GOWN STRL REUS W/ TWL LRG LVL3 (GOWN DISPOSABLE) IMPLANT
GOWN STRL REUS W/ TWL XL LVL3 (GOWN DISPOSABLE) ×2 IMPLANT
GOWN STRL REUS W/TWL LRG LVL3 (GOWN DISPOSABLE)
GOWN STRL REUS W/TWL XL LVL3 (GOWN DISPOSABLE) ×4
INSERT HUMERAL MED 39/ +3 (Shoulder) IMPLANT
INSERT MEDIUM HUMERAL 39/ +3 (Shoulder) ×1 IMPLANT
KIT BASIN OR (CUSTOM PROCEDURE TRAY) ×2 IMPLANT
KIT ROOM TURNOVER OR (KITS) ×2 IMPLANT
MANIFOLD NEPTUNE II (INSTRUMENTS) ×2 IMPLANT
MANIFOLD NEPTUNE WASTE (CANNULA) ×1 IMPLANT
NDL SUT .5 MAYO 1.404X.05X (NEEDLE) ×1 IMPLANT
NEEDLE MAYO TAPER (NEEDLE)
NS IRRIG 1000ML POUR BTL (IV SOLUTION) ×2 IMPLANT
PACK SHOULDER (CUSTOM PROCEDURE TRAY) ×2 IMPLANT
PAD ARMBOARD 7.5X6 YLW CONV (MISCELLANEOUS) ×4 IMPLANT
PASSER SUT SWANSON 36MM LOOP (INSTRUMENTS) IMPLANT
RESTRAINT HEAD UNIVERSAL NS (MISCELLANEOUS) ×2 IMPLANT
SCREW CENTRAL NONLOCK 6.5X20MM (Shoulder) ×1 IMPLANT
SCREW LOCK PERIPHERAL 42MM (Screw) ×2 IMPLANT
SET PIN UNIVERSAL REVERSE (SET/KITS/TRAYS/PACK) ×2 IMPLANT
SLING ARM FOAM STRAP LRG (SOFTGOODS) IMPLANT
SPONGE LAP 18X18 X RAY DECT (DISPOSABLE) ×2 IMPLANT
STEM HUMERAL UNI REVERS SZ9 (Stem) ×1 IMPLANT
SUCTION FRAZIER HANDLE 10FR (MISCELLANEOUS) ×1
SUCTION TUBE FRAZIER 10FR DISP (MISCELLANEOUS) ×1 IMPLANT
SUT BONE WAX W31G (SUTURE) IMPLANT
SUT FIBERWIRE #2 38 T-5 BLUE (SUTURE) ×4
SUT MNCRL AB 3-0 PS2 18 (SUTURE) ×2 IMPLANT
SUT MON AB 2-0 CT1 36 (SUTURE) ×2 IMPLANT
SUT VIC AB 1 CT1 27 (SUTURE) ×2
SUT VIC AB 1 CT1 27XBRD ANBCTR (SUTURE) ×1 IMPLANT
SUT VIC AB 2-0 CT1 27 (SUTURE)
SUT VIC AB 2-0 CT1 TAPERPNT 27 (SUTURE) IMPLANT
SUTURE FIBERWR #2 38 T-5 BLUE (SUTURE) ×2 IMPLANT
SYR CONTROL 10ML LL (SYRINGE) IMPLANT
TOWEL OR 17X24 6PK STRL BLUE (TOWEL DISPOSABLE) ×2 IMPLANT
TOWEL OR 17X26 10 PK STRL BLUE (TOWEL DISPOSABLE) ×2 IMPLANT

## 2016-11-04 NOTE — Op Note (Signed)
11/04/2016  12:15 PM  PATIENT:   Sheralyn Boatman  70 y.o. male  PRE-OPERATIVE DIAGNOSIS:  LEFT SHOULDER ROTATOR CUFF TEAR ARTHROPATHY  POST-OPERATIVE DIAGNOSIS:  same  PROCEDURE:  L reverse shoulder arthroplasty #9 stem, +3 poly, 39/+4 glenosphere  SURGEON:  Venetia Prewitt, Metta Clines M.D.  ASSISTANTS: Shuford pac   ANESTHESIA:   GET + ISB  EBL: 250  SPECIMEN:  none  Drains: none   PATIENT DISPOSITION:  PACU - hemodynamically stable.    PLAN OF CARE: Admit for overnight observation  Dictation# [id number]   Contact # [phone]

## 2016-11-04 NOTE — Transfer of Care (Signed)
Immediate Anesthesia Transfer of Care Note  Patient: Reginald Chambers  Procedure(s) Performed: Procedure(s): LEFT REVERSE SHOULDER ARTHROPLASTY (Left)  Patient Location: PACU  Anesthesia Type:General and Regional  Level of Consciousness: awake, alert  and oriented  Airway & Oxygen Therapy: Patient Spontanous Breathing and Patient connected to nasal cannula oxygen  Post-op Assessment: Report given to RN and Post -op Vital signs reviewed and stable  Post vital signs: Reviewed and stable  Last Vitals:  Vitals:   11/04/16 0806  BP: 126/75  Pulse: 89  Resp: 18  Temp: 36.9 C    Last Pain:  Vitals:   11/04/16 0806  TempSrc: Oral      Patients Stated Pain Goal: 2 ([id number])  Complications: No apparent anesthesia complications

## 2016-11-04 NOTE — Discharge Instructions (Signed)

## 2016-11-04 NOTE — Anesthesia Procedure Notes (Signed)
Anesthesia Regional Block:  Interscalene brachial plexus block  Pre-Anesthetic Checklist: ,, timeout performed, Correct Patient, Correct Site, Correct Laterality, Correct Procedure, Correct Position, site marked, Risks and benefits discussed,  Surgical consent,  Pre-op evaluation,  At surgeon's request and post-op pain management  Laterality: Upper and Left  Prep: chloraprep       Needles:  Injection technique: Single-shot  Needle Type: Echogenic Stimulator Needle          Additional Needles:  Procedures: ultrasound guided (picture in chart) Interscalene brachial plexus block Narrative:  Start time: 11/04/2016 10:00 AM End time: 11/04/2016 10:08 AM Injection made incrementally with aspirations every 5 mL.  Performed by: Personally  Anesthesiologist: Rosa Wyly  Additional Notes: H+P and labs reviewed, risks and benefits discussed with patient, procedure tolerated well without complications

## 2016-11-04 NOTE — Anesthesia Postprocedure Evaluation (Signed)
Anesthesia Post Note  Patient: Reginald Chambers  Procedure(s) Performed: Procedure(s) (LRB): LEFT REVERSE SHOULDER ARTHROPLASTY (Left)  Patient location during evaluation: PACU Anesthesia Type: General and Regional Level of consciousness: awake and alert Pain management: pain level controlled Vital Signs Assessment: post-procedure vital signs reviewed and stable Respiratory status: spontaneous breathing, nonlabored ventilation, respiratory function stable and patient connected to nasal cannula oxygen Cardiovascular status: blood pressure returned to baseline and stable Postop Assessment: no signs of nausea or vomiting Anesthetic complications: no    Last Vitals:  Vitals:   11/04/16 1345 11/04/16 1403  BP:  123/65  Pulse: 72 74  Resp: 15 16  Temp: 36.7 C 36.5 C    Last Pain:  Vitals:   11/04/16 1403  TempSrc: Oral  PainSc:                  [name]

## 2016-11-04 NOTE — Anesthesia Preprocedure Evaluation (Addendum)
Anesthesia Evaluation  Patient identified by MRN, date of birth, ID band Patient awake    Reviewed: Allergy & Precautions, NPO status , Patient's Chart, lab work & pertinent test results  Airway Mallampati: III  TM Distance: >3 FB Neck ROM: Full    Dental  (+) Dental Advisory Given, Teeth Intact   Pulmonary former smoker,    breath sounds clear to auscultation       Cardiovascular hypertension, Pt. on medications  Rhythm:Regular     Neuro/Psych    GI/Hepatic GERD  Medicated,  Endo/Other  diabetes, Type 2, Oral Hypoglycemic Agents  Renal/GU      Musculoskeletal   Abdominal   Peds  Hematology   Anesthesia Other Findings   Reproductive/Obstetrics                           Anesthesia Physical Anesthesia Plan  ASA: II  Anesthesia Plan: General   Post-op Pain Management: GA combined w/ Regional for post-op pain   Induction: Intravenous  Airway Management Planned: Oral ETT  Additional Equipment:   Intra-op Plan:   Post-operative Plan: Extubation in OR  Informed Consent: I have reviewed the patients History and Physical, chart, labs and discussed the procedure including the risks, benefits and alternatives for the proposed anesthesia with the patient or authorized representative who has indicated his/her understanding and acceptance.   Dental advisory given  Plan Discussed with: CRNA, Anesthesiologist and Surgeon  Anesthesia Plan Comments:         Anesthesia Quick Evaluation

## 2016-11-04 NOTE — Plan of Care (Signed)
Problem: Pain Managment: Goal: General experience of comfort will improve Outcome: Progressing Medicated once for pain with full relief  Problem: Physical Regulation: Goal: Will remain free from infection Outcome: Progressing No S/S of infection noted  Problem: Tissue Perfusion: Goal: Risk factors for ineffective tissue perfusion will decrease Outcome: Progressing Denies S/S of DVT  Problem: Activity: Goal: Risk for activity intolerance will decrease Outcome: Progressing OOB to BR, tolerates well   Problem: Bowel/Gastric: Goal: Will not experience complications related to bowel motility Denies gastric and bowel motility issues

## 2016-11-04 NOTE — H&P (Signed)
Reginald Chambers    Chief Complaint: LEFT SHOULDER ROTATOR CUPP TEAR ARTHROPATHY HPI: The patient is a 70 y.o. 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 Cancer ([place])    skin cancer- basal cell CA  . Essential hypertension, benign   . GERD (gastroesophageal reflux disease)    pt. denies  . Hard of hearing   . Heart murmur    at birth  . History of agent Orange exposure    [place]  . History of skin cancer   . Malaria   . Mixed hyperlipidemia   . Nocturia   . Osteoarthritis   . Personal history of colonic polyps - adenoma 04/02/2009  . S/P colonoscopy May 2010   73mm sessile polyp, adenoma in ascending colon, mild diverticulosis in sigmoid, internal hemorrhoids, needs repeat in 2015. Performed by Dr. Carlean Purl  . Tinnitus    SINCE 1968  . Type 2 diabetes mellitus ([place])     Past Surgical History:  Procedure Laterality Date  . APPENDECTOMY  1959  . BILATERAL KNEE ARTHROSCOPY    . COLONOSCOPY  04/02/09   7 mm sessile polyp ascending colon/internal hemmorrhoids/mild diverticulosis sigmoid colon  . EYE SURGERY Bilateral    cataracts  . right shoulder arthroscopy    . ROTATOR CUFF REPAIR  1990   Left shoulder  . TOTAL KNEE ARTHROPLASTY  2007   Left knee  . TOTAL KNEE ARTHROPLASTY Right 12/24/2014   Procedure: RIGHT TOTAL KNEE ARTHROPLASTY;  Surgeon: Sydnee Cabal, MD;  Location: [facility];  Service: Orthopedics;  Laterality: Right;    Family History  Problem Relation Age of Onset  . Lymphoma Father   . Heart failure Mother   . Diabetes type II Mother   . Colon cancer Neg Hx   . Esophageal cancer Neg Hx   . Stomach cancer Neg Hx   . Rectal cancer Neg Hx     Social History:  reports that he quit smoking about 25 years ago. His smoking use included Cigarettes. He has a 5.00 pack-year smoking history. He has never used smokeless tobacco. He reports that he drinks about 1.2 oz of alcohol per week . He reports that he  does not use drugs.   Medications Prior to Admission  Medication Sig Dispense Refill  . aspirin 81 MG tablet Take 81 mg by mouth 2 (two) times daily.    . canagliflozin (INVOKANA) 300 MG TABS tablet Take 300 mg by mouth every evening.    [name] glipiZIDE (GLUCOTROL) 10 MG tablet Take 10 mg by mouth 2 (two) times daily.  3  . metFORMIN (GLUCOPHAGE) 1000 MG tablet Take 1,000 mg by mouth 2 (two) times daily.  0  . Omega-3 Fatty Acids (FISH OIL) 1000 MG CAPS Take 1,000 mg by mouth daily.    . Polyvinyl Alcohol-Povidone (CLEAR EYES NATURAL TEARS OP) Apply 2 drops to eye daily as needed (dry eyes).    . simvastatin (ZOCOR) 40 MG tablet Take 40 mg by mouth at bedtime.     [name] telmisartan-hydrochlorothiazide (MICARDIS HCT) 40-12.5 MG per tablet Take 1 tablet by mouth every morning.        Physical Exam: left shoulder with painful and restricted motion as noted at recent office visits  Vitals  Temp:  [98.4 F (36.9 C)] 98.4 F (36.9 C) (12/07 0806) Pulse Rate:  [89] 89 (12/07 0806) Resp:  [18] 18 (12/07 0806) BP: (126)/(75) 126/75 (12/07 0806) SpO2:  [  96 %] 96 % (12/07 0806) Weight:  [96.5 kg (212 lb 11.2 oz)] 96.5 kg (212 lb 11.2 oz) (12/07 0806)  Assessment/Plan  Impression: LEFT SHOULDER ROTATOR CUFF TEAR ARTHROPATHY  Plan of Action: Procedure(s): LEFT REVERSE SHOULDER ARTHROPLASTY  Nayellie Sanseverino M Myquan [place] 11/04/2016, 9:34 AM Contact # [phone]

## 2016-11-04 NOTE — Anesthesia Procedure Notes (Signed)
Procedure Name: Intubation Date/Time: 11/04/2016 10:27 AM Performed by: Garrison Columbus T Pre-anesthesia Checklist: Patient identified, Emergency Drugs available, Suction available and Patient being monitored Patient Re-evaluated:Patient Re-evaluated prior to inductionOxygen Delivery Method: Circle System Utilized Preoxygenation: Pre-oxygenation with 100% oxygen Intubation Type: IV induction Ventilation: Mask ventilation without difficulty and Oral airway inserted - appropriate to patient size Laryngoscope Size: [name] Heck and 2 Grade View: Grade I Tube type: Oral Tube size: 7.5 mm Number of attempts: 1 Airway Equipment and Method: Stylet and Oral airway Placement Confirmation: ETT inserted through vocal cords under direct vision,  positive ETCO2 and breath sounds checked- equal and bilateral Secured at: 23 cm Tube secured with: Tape Dental Injury: Teeth and Oropharynx as per pre-operative assessment

## 2016-11-05 DIAGNOSIS — M75102 Unspecified rotator cuff tear or rupture of left shoulder, not specified as traumatic: Secondary | ICD-10-CM | POA: Diagnosis not present

## 2016-11-05 LAB — GLUCOSE, CAPILLARY: Glucose-Capillary: 204 mg/dL — ABNORMAL HIGH (ref 65–99)

## 2016-11-05 MED ORDER — DIAZEPAM 5 MG PO TABS: 2.5000 mg | ORAL_TABLET | Freq: Four times a day (QID) | ORAL | 1 refills | Status: DC | PRN

## 2016-11-05 MED ORDER — ONDANSETRON HCL 4 MG PO TABS
4.0000 mg | ORAL_TABLET | Freq: Three times a day (TID) | ORAL | 0 refills | Status: DC | PRN
Start: 2016-11-05 — End: 2018-03-27

## 2016-11-05 MED ORDER — OXYCODONE-ACETAMINOPHEN 5-325 MG PO TABS: 1.0000 | ORAL_TABLET | ORAL | 0 refills | Status: DC | PRN

## 2016-11-05 NOTE — Discharge Summary (Signed)
PATIENT ID:      Reginald Chambers  MRN:     [id number] DOB/AGE:    02/10/1946 / 70 y.o.     DISCHARGE SUMMARY  ADMISSION DATE:    11/04/2016 DISCHARGE DATE:    ADMISSION DIAGNOSIS: LEFT SHOULDER ROTATOR CUPP TEAR ARTHROPATHY Past Medical History:  Diagnosis Date  . Cancer ([place])    skin cancer- basal cell CA  . Essential hypertension, benign   . GERD (gastroesophageal reflux disease)    pt. denies  . Hard of hearing   . Heart murmur    at birth  . History of agent Orange exposure    [place]  . History of skin cancer   . Malaria   . Mixed hyperlipidemia   . Nocturia   . Osteoarthritis   . Personal history of colonic polyps - adenoma 04/02/2009  . S/P colonoscopy May 2010   4mm sessile polyp, adenoma in ascending colon, mild diverticulosis in sigmoid, internal hemorrhoids, needs repeat in 2015. Performed by Dr. Carlean Purl  . Tinnitus    SINCE 1968  . Type 2 diabetes mellitus ([place])     DISCHARGE DIAGNOSIS:   Active Problems:   S/P reverse total shoulder arthroplasty, left   PROCEDURE: Procedure(s): LEFT REVERSE SHOULDER ARTHROPLASTY on 11/04/2016  CONSULTS:    HISTORY:  See H&P in chart.  HOSPITAL COURSE:  DON SAUVAGEAU is a 70 y.o. admitted on 11/04/2016 with a diagnosis of LEFT SHOULDER ROTATOR CUPP TEAR ARTHROPATHY.  They were brought to the operating room on 11/04/2016 and underwent Procedure(s): LEFT REVERSE SHOULDER ARTHROPLASTY.    They were given perioperative antibiotics: Anti-infectives    Start     Dose/Rate Route Frequency Ordered Stop   11/04/16 1630  ceFAZolin (ANCEF) IVPB 2g/100 mL premix     2 g 200 mL/hr over 30 Minutes Intravenous Every 6 hours 11/04/16 1357 11/05/16 0533   11/04/16 0945  ceFAZolin (ANCEF) IVPB 2g/100 mL premix     2 g 200 mL/hr over 30 Minutes Intravenous To Surgery 11/04/16 0801 11/04/16 1040   11/04/16 0804  ceFAZolin (ANCEF) 2-4 GM/100ML-% IVPB    Comments:  Ronnald Ramp, [place]   : cabinet override      11/04/16 0804 11/04/16 1030     .  Patient underwent the above named procedure and tolerated it well. The following day they were hemodynamically stable and pain was controlled on oral analgesics. They were neurovascularly intact to the operative extremity. OT was ordered and worked with patient per protocol. They were medically and orthopaedically stable for discharge on day1 .    DIAGNOSTIC STUDIES:  RECENT RADIOGRAPHIC STUDIES :  No results found.  RECENT VITAL SIGNS:  Patient Vitals for the past 24 hrs:  BP Temp Temp src Pulse Resp SpO2  11/05/16 0656 - - - - - 92 %  11/05/16 0626 (!) 109/55 98.2 F (36.8 C) Oral 74 16 90 %  11/04/16 2004 (!) 116/50 98 F (36.7 C) Oral 85 16 96 %  11/04/16 1403 123/65 97.7 F (36.5 C) Oral 74 16 97 %  11/04/16 1345 - 98.1 F (36.7 C) - 72 15 100 %  11/04/16 1341 - - - 77 16 100 %  11/04/16 1333 125/73 - - - - -  11/04/16 1330 - - - 80 12 100 %  11/04/16 1318 126/77 - - - - -  11/04/16 1315 - - - 74 17 99 %  11/04/16 1303 136/77 - - - - -  11/04/16 1300 - - -  75 15 98 %  11/04/16 1248 137/82 - - - - -  11/04/16 1245 - - - 78 16 98 %  11/04/16 1233 138/80 97.9 F (36.6 C) - 89 18 97 %  .  RECENT EKG RESULTS:    Orders placed or performed in visit on 04/14/16  . EKG 12-Lead    DISCHARGE INSTRUCTIONS:    DISCHARGE MEDICATIONS:     Medication List    TAKE these medications   aspirin 81 MG tablet Take 81 mg by mouth 2 (two) times daily.   CLEAR EYES NATURAL TEARS OP Apply 2 drops to eye daily as needed (dry eyes).   diazepam 5 MG tablet Commonly known as:  VALIUM Take 0.5-1 tablets (2.5-5 mg total) by mouth every 6 (six) hours as needed for muscle spasms or sedation.   Fish Oil 1000 MG Caps Take 1,000 mg by mouth daily.   glipiZIDE 10 MG tablet Commonly known as:  GLUCOTROL Take 10 mg by mouth 2 (two) times daily.   INVOKANA 300 MG Tabs tablet Generic drug:  canagliflozin Take 300 mg by mouth every evening.   metFORMIN 1000 MG tablet Commonly  known as:  GLUCOPHAGE Take 1,000 mg by mouth 2 (two) times daily.   ondansetron 4 MG tablet Commonly known as:  ZOFRAN Take 1 tablet (4 mg total) by mouth every 8 (eight) hours as needed for nausea or vomiting.   oxyCODONE-acetaminophen 5-325 MG tablet Commonly known as:  PERCOCET Take 1-2 tablets by mouth every 4 (four) hours as needed.   simvastatin 40 MG tablet Commonly known as:  ZOCOR Take 40 mg by mouth at bedtime.   telmisartan-hydrochlorothiazide 40-12.5 MG tablet Commonly known as:  MICARDIS HCT Take 1 tablet by mouth every morning.       FOLLOW UP VISIT:   Follow-up Information    Metta Clines SUPPLE, MD.   Specialty:  Orthopedic Surgery Why:  call to be seen in 10-14 days Contact information: [address] [place] [address] [id number]           DISCHARGE TO: Home   DISPOSITION: Good  DISCHARGE CONDITION:  Festus Chambers for Dr. Justice Britain 11/05/2016, 8:17 AM

## 2016-11-05 NOTE — Evaluation (Addendum)
Occupational Therapy Evaluation Patient Details Name: Reginald Chambers MRN: [id number] DOB: 01-Jan-1946 Today's Date: 11/05/2016    History of Present Illness Pt is a 70 y.o. male s/p L reverse shoulder arthroplasty. Pt has a PMH significant for hypertension, GERD, malaria, mixed hyperlipidemia, osteoarthritis, nocturia, type 2 diabetes mellitus. Past surgical history of B knee arthroscopy, eye surgery, L and R total knee arthroplasty.   Clinical Impression   PTA, pt was independent with ADL and functional mobility. He is recently retired and enjoys Careers information officer. Pt currently requires max assist for UB dressing and supervision for ADL and HEP. Pt and wife educated concerning active shoulder protocol HEP per MD order, dressing and bathing techniques, and safety with functional transfers. Pt's wife engaged in session and able to provide necessary assistance. Pt tolerated treatment well, but did become dizzy/nauseated after approximately 10 minutes of standing activity, which resolved after lying down. No dizziness reported on initial positional changes.  Pt would benefit from continued OT services while admitted to improve independence with ADL and reinforce active protocol education. OT will continue to follow acutely.   Follow Up Recommendations  No OT follow up;Supervision/Assistance - 24 hour;Other (comment) (Follow-up per MD)    Equipment Recommendations  None recommended by OT    Recommendations for Other Services       Precautions / Restrictions Precautions Precautions: Shoulder Type of Shoulder Precautions: Active protocol: NWB L UE, AROM/PROM forward flexion 0-90, AROM/PROM abduction 0-60, no external rotation, sling (may come out in controlled environment). Shoulder Interventions: Shoulder sling/immobilizer Precaution Booklet Issued: Yes (comment) Precaution Comments: Reviewed HEP and active shoulder protocol. Required Braces or Orthoses: Sling (L shoulder) Restrictions Weight Bearing  Restrictions: No LUE Weight Bearing: Non weight bearing      Mobility Bed Mobility Overal bed mobility: Needs Assistance Bed Mobility: Supine to Sit;Sit to Supine     Supine to sit: Modified independent (Device/Increase time) Sit to supine: Min assist   General bed mobility comments: Pt dizzy after approximately 10 minutes of standing activity and required min assist to return to bed due to dizziness and nausea. Dizziness resolved with reclined positioning.  Transfers Overall transfer level: Needs assistance Equipment used: None Transfers: Sit to/from Stand Sit to Stand: Supervision              Balance Overall balance assessment: Needs assistance Sitting-balance support: No upper extremity supported;Feet supported Sitting balance-Leahy Scale: Good     Standing balance support: No upper extremity supported;During functional activity Standing balance-Leahy Scale: Good                              ADL Overall ADL's : Needs assistance/impaired     Grooming: Supervision/safety;Wash/dry hands;Standing   Upper Body Bathing: Moderate assistance;Sitting   Lower Body Bathing: Minimal assistance;Sit to/from stand   Upper Body Dressing : Sitting;Maximal assistance   Lower Body Dressing: Minimal assistance;Sit to/from stand   Toilet Transfer: Supervision/safety;Ambulation   Toileting- Clothing Manipulation and Hygiene: Supervision/safety;Sit to/from stand       Functional mobility during ADLs: Supervision/safety General ADL Comments: Pt and wife educated on active shoulder protocol, dressing and bathing techniques, and HEP with handout provided.     Vision Vision Assessment?: No apparent visual deficits   Perception     Praxis      Pertinent Vitals/Pain Pain Assessment: Faces Faces Pain Scale: Hurts even more Pain Location: L shoulder with exercises Pain Descriptors / Indicators: Aching;Sore;Operative site guarding Pain Intervention(s):  Limited  activity within patient's tolerance;Monitored during session;Repositioned     Hand Dominance Right   Extremity/Trunk Assessment Upper Extremity Assessment Upper Extremity Assessment: LUE deficits/detail LUE Deficits / Details: Decreased strength and ROM as expected post-operatively. LUE: Unable to fully assess due to pain   Lower Extremity Assessment Lower Extremity Assessment: Overall WFL for tasks assessed       Communication Communication Communication: No difficulties   Cognition Arousal/Alertness: Awake/alert Behavior During Therapy: WFL for tasks assessed/performed Overall Cognitive Status: Within Functional Limits for tasks assessed                     General Comments       Exercises Exercises: Shoulder     Shoulder Instructions Shoulder Instructions Donning/doffing shirt without moving shoulder: Maximal assistance Method for sponge bathing under operated UE: Moderate assistance Donning/doffing sling/immobilizer: Maximal assistance Correct positioning of sling/immobilizer: Minimal assistance ROM for elbow, wrist and digits of operated UE: Supervision/safety Sling wearing schedule (on at all times/off for ADL's): Supervision/safety Proper positioning of operated UE when showering: Supervision/safety Positioning of UE while sleeping: Supervision/safety    Home Living Family/patient expects to be discharged to:: Private residence Living Arrangements: Spouse/significant other Available Help at Discharge: Family;Available 24 hours/day Type of Home: House Home Access: Stairs to enter [employer] of Steps: 2 Entrance Stairs-Rails: None Home Layout: Two level;Able to live on main level with bedroom/bathroom     Bathroom Shower/Tub: Tub/shower unit Shower/tub characteristics: Architectural technologist: Standard     Home Equipment: Grab bars - tub/shower;Hand held Tourist information centre manager - 2 wheels;Cane - single point;Bedside commode           Prior Functioning/Environment Level of Independence: Independent        Comments: avid golfer        OT Problem List: Decreased strength;Decreased range of motion;Decreased activity tolerance;Impaired balance (sitting and/or standing);Decreased safety awareness;Decreased knowledge of precautions;Pain   OT Treatment/Interventions: Self-care/ADL training;Therapeutic exercise;Energy conservation;Therapeutic activities;Patient/family education;Balance training    OT Goals(Current goals can be found in the care plan section) Acute Rehab OT Goals Patient Stated Goal: get back to golfing OT Goal Formulation: With patient/family Time For Goal Achievement: 11/12/16 Potential to Achieve Goals: Good ADL Goals Pt Will Perform Upper Body Bathing: with min assist;sitting Pt Will Perform Upper Body Dressing: with min assist;sitting Pt Will Transfer to Toilet: with modified independence;ambulating Pt Will Perform Toileting - Clothing Manipulation and hygiene: with modified independence;sit to/from stand Pt Will Perform Tub/Shower Transfer: with modified independence;ambulating Pt/caregiver will Perform Home Exercise Program: Increased ROM;Left upper extremity;Independently;With written HEP provided  OT Frequency: Min 1X/week   Barriers to D/C:            Co-evaluation              End of Session Equipment Utilized During Treatment: Gait belt  Activity Tolerance: Treatment limited secondary to medical complications (Comment) (Pt becoming dizzy/nauseated. Resolved with reclined position) Patient left: in bed;with call bell/phone within reach   Time: 0727-0759 OT Time Calculation (min): 32 min Charges:  OT General Charges $OT Visit: 1 Procedure OT Evaluation $OT Eval Moderate Complexity: 1 Procedure OT Treatments $Self Care/Home Management : 8-22 mins  Norman Herrlich, OTR/L [phone] 11/05/2016, 8:36 AM

## 2016-11-05 NOTE — Op Note (Signed)
NAMEKUSHAGRA, BIGHAM              ACCOUNT NO.:  [id number]  MEDICAL RECORD NO.:  [id number]  LOCATION:  PERIO                        FACILITY:  [facility]  PHYSICIAN:  Metta Clines. Laurabeth Yip, M.D.  DATE OF BIRTH:  1945/12/20  DATE OF PROCEDURE:  11/04/2016 DATE OF DISCHARGE:                              OPERATIVE REPORT   PREOPERATIVE DIAGNOSIS:  End-stage left shoulder rotator cuff tear arthropathy.  POSTOPERATIVE DIAGNOSIS:  End-stage left shoulder rotator cuff tear arthropathy.  PROCEDURE:  Left shoulder reverse arthroplasty utilizing a press-fit size 9 Arthrex stem with a +3 polyethylene insert, a neutral metaphysis and a 39+4 Glenosphere on a small baseplate.  SURGEON:  Metta Clines. Kamika Goodloe, M.D.  ASSISTANT:  Jenetta Loges, PA-C.  ANESTHESIA:  General endotracheal as well as interscalene block.  ESTIMATED BLOOD LOSS:  150 mL.  DRAINS:  None.  HISTORY:  Mr. Felber is a 70 year old gentleman with a long history of left shoulder pain with severe functional limitations and increasing difficulties with ADLs with profound restrictions in mobility and his clinical examination.  His plain radiographs show complete obliteration of the joint space with high riding humeral head and prominent peripheral osteophytes.  Due to his increasing pain and functional limitations, he was brought to the operating room at this time for planned left reverse shoulder arthroplasty.  Preoperatively, I counseled Mr. Carfagno regarding treatment options and potential risks versus benefits thereof.  Possible surgical complications were all reviewed including bleeding, infection, neurovascular injury, persistent pain, loss of motion, anesthetic complication, failure of the implant, and possible need for additional surgery, were all reviewed.  He understands and accepts and agrees with the plan.  PROCEDURE IN DETAIL:  After undergoing routine preop evaluation, the patient received prophylactic antibiotics.  An  interscalene block was established in holding area by the Anesthesia Department.  Placed supine on the operating table.  Underwent smooth induction of a general endotracheal anesthesia.  Placed in the beach-chair position and appropriately padded and protected.  Left shoulder girdle region was sterilely prepped and draped in standard fashion.  Time-out was called. An anterior deltopectoral approach to the left shoulder was made through an 8-cm anterior incision.  Skin flaps were elevated.  Electrocautery was used for hemostasis.  Dissection carried deeply with deltopectoral interval developed from proximal to distal and the upper centimeter of the pec major tendon was tenotomized to enhance exposure.  We retracted the vein laterally.  There were number of adhesions beneath the deltoid due to his previous mini-open rotator cuff repair and these adhesions were sharply divided.  There were marked adhesions beneath the acromion as well, with complete loss of articulation between the acromion and the remnants of the rotator cuff.  I did divide through this area with electrocautery.  We also separated the subscapularis from the lesser tuberosity and the subscap tissue was completely scarified with no elasticity and so did not feel that any attempt at retaining the subscapularis would be of benefit.  We did mobilize and retract the conjoined tendon medially.  We then divided the capsular attachments from the anteroinferior and inferior aspects of the humeral neck and there are very prominent osteophytes noted.  We were able to deliver the head  through the wound and then using the extramedullary guide, outlined our proposed humeral head resection, which was performed at approximately 10 degrees of retroversion with an oscillating saw and removed the head segment and then removed the osteophytes from the humeral neck with a rongeur.  We placed a metal cap over the cut proximal humeral surface and  then worked to expose the glenoid with combination of Fukuda, pitchfork, and snake tongue retractors.  We performed a circumferential labral resection, gained complete visualization of the periphery of the glenoid, placed a central guidepin and then reamed with the appropriate central reamer obtaining appropriate bony bed for repair and then used the peripheral reamer to again obtain excellent bony visualization and exposure.  We then impacted our small glenoid baseplate and this was transfixed with a central lag screw and the inferior and superior locking screws, all of which obtained excellent fit and fixation.  We then selected the 39+4 Glenosphere and placed it onto the baseplate and impacted this, and found good stability.  At this time, we then returned our attention back to the proximal humerus and we prepared the humeral canal, reaming up to size 8, broached up to size 9 with excellent fixation.  We then prepared the humeral metaphysis with our neutral reamer.  We then performed a trial reduction of the trial implants, which showed good soft tissue balance.  At this point, the trials were then removed.  We irrigated the canal.  Our implant was then assembled on the back table.  It was impacted into position with excellent fit and fixation.  We then once again performed trial reduction, +3 poly showed excellent soft tissue balance.  The shoulder was dislocated, trial was removed.  We impacted the final +3 poly into our metaphysis.  The shoulder was then reduced finally with excellent soft tissue balance, good stability, and good motion.  The wound was copiously irrigated and hemostasis was obtained. The deltopectoral interval was then reapproximated with a series of figure-of-eight #1 Vicryl sutures, 2-0 Monocryl was used for the subcu layer, intracuticular, and 3-0 Monocryl for the skin, followed by Dermabond and Aquacel dressing.  Left arm was placed in a sling.  The patient was  awakened, extubated, and taken to recovery room in stable condition.  Jenetta Loges, PA-C, was used as an Environmental consultant throughout this case, was essential for help with positioning the patient, positioning the extremity, management of the tissue retractors, implantation of prosthesis, wound closure, and intraoperative decision making.     Metta Clines. Ala Capri, M.D.     KMS/MEDQ  D:  11/04/2016  T:  11/05/2016  Job:  [id number]

## 2016-11-05 NOTE — Care Management Obs Status (Signed)
MEDICARE OBSERVATION STATUS NOTIFICATION   Patient Details  Name: Reginald Chambers MRN: [id number] Date of Birth: 01/06/1946   Medicare Observation Status Notification Given:  Yes    Ninfa Meeker, RN 11/05/2016, 10:40 AM

## 2016-11-05 NOTE — Care Management (Signed)
Patient has no Home Health needs, has signed OBS notice.

## 2016-11-05 NOTE — Progress Notes (Addendum)
Late entry for missed G-code:   11-20-16 0700  OT G-codes **NOT FOR INPATIENT CLASS**  Functional Assessment Tool Used clinical judgement  Functional Limitation Self care  Self Care Current Status [phone]) CK  Self Care Goal Status [phone]) CI   [place], OTR/L [phone]

## 2016-11-05 NOTE — Progress Notes (Signed)
Discharge instructions and prescriptions given and reviewed with patient. Patient stated understanding. IV removed. Wife at bedside for transportation [place], Therapist, sports

## 2016-11-09 ENCOUNTER — Encounter (HOSPITAL_COMMUNITY): Payer: Self-pay | Admitting: Orthopedic Surgery

## 2016-11-17 DIAGNOSIS — Z96612 Presence of left artificial shoulder joint: Secondary | ICD-10-CM | POA: Diagnosis not present

## 2016-11-17 DIAGNOSIS — M25552 Pain in left hip: Secondary | ICD-10-CM | POA: Diagnosis not present

## 2016-11-17 DIAGNOSIS — M62512 Muscle wasting and atrophy, not elsewhere classified, left shoulder: Secondary | ICD-10-CM | POA: Diagnosis not present

## 2016-11-17 DIAGNOSIS — E119 Type 2 diabetes mellitus without complications: Secondary | ICD-10-CM | POA: Diagnosis not present

## 2016-11-17 DIAGNOSIS — M62412 Contracture of muscle, left shoulder: Secondary | ICD-10-CM | POA: Diagnosis not present

## 2016-11-17 DIAGNOSIS — M25561 Pain in right knee: Secondary | ICD-10-CM | POA: Diagnosis not present

## 2016-11-17 DIAGNOSIS — M25612 Stiffness of left shoulder, not elsewhere classified: Secondary | ICD-10-CM | POA: Diagnosis not present

## 2016-11-17 DIAGNOSIS — M25512 Pain in left shoulder: Secondary | ICD-10-CM | POA: Diagnosis not present

## 2016-11-17 DIAGNOSIS — I10 Essential (primary) hypertension: Secondary | ICD-10-CM | POA: Diagnosis not present

## 2016-11-17 DIAGNOSIS — M25551 Pain in right hip: Secondary | ICD-10-CM | POA: Diagnosis not present

## 2016-11-17 DIAGNOSIS — M150 Primary generalized (osteo)arthritis: Secondary | ICD-10-CM | POA: Diagnosis not present

## 2016-11-18 DIAGNOSIS — Z96612 Presence of left artificial shoulder joint: Secondary | ICD-10-CM | POA: Diagnosis not present

## 2016-11-18 DIAGNOSIS — M25512 Pain in left shoulder: Secondary | ICD-10-CM | POA: Diagnosis not present

## 2016-11-18 DIAGNOSIS — M25612 Stiffness of left shoulder, not elsewhere classified: Secondary | ICD-10-CM | POA: Diagnosis not present

## 2016-11-18 DIAGNOSIS — M62512 Muscle wasting and atrophy, not elsewhere classified, left shoulder: Secondary | ICD-10-CM | POA: Diagnosis not present

## 2016-12-02 DIAGNOSIS — Z96612 Presence of left artificial shoulder joint: Secondary | ICD-10-CM | POA: Diagnosis not present

## 2016-12-02 DIAGNOSIS — M62412 Contracture of muscle, left shoulder: Secondary | ICD-10-CM | POA: Diagnosis not present

## 2016-12-02 DIAGNOSIS — E119 Type 2 diabetes mellitus without complications: Secondary | ICD-10-CM | POA: Diagnosis not present

## 2016-12-02 DIAGNOSIS — I10 Essential (primary) hypertension: Secondary | ICD-10-CM | POA: Diagnosis not present

## 2016-12-02 DIAGNOSIS — M25552 Pain in left hip: Secondary | ICD-10-CM | POA: Diagnosis not present

## 2016-12-02 DIAGNOSIS — M62512 Muscle wasting and atrophy, not elsewhere classified, left shoulder: Secondary | ICD-10-CM | POA: Diagnosis not present

## 2016-12-02 DIAGNOSIS — M25612 Stiffness of left shoulder, not elsewhere classified: Secondary | ICD-10-CM | POA: Diagnosis not present

## 2016-12-02 DIAGNOSIS — M25561 Pain in right knee: Secondary | ICD-10-CM | POA: Diagnosis not present

## 2016-12-02 DIAGNOSIS — M25512 Pain in left shoulder: Secondary | ICD-10-CM | POA: Diagnosis not present

## 2016-12-02 DIAGNOSIS — M25551 Pain in right hip: Secondary | ICD-10-CM | POA: Diagnosis not present

## 2016-12-02 DIAGNOSIS — M150 Primary generalized (osteo)arthritis: Secondary | ICD-10-CM | POA: Diagnosis not present

## 2016-12-06 DIAGNOSIS — M25512 Pain in left shoulder: Secondary | ICD-10-CM | POA: Diagnosis not present

## 2016-12-06 DIAGNOSIS — M150 Primary generalized (osteo)arthritis: Secondary | ICD-10-CM | POA: Diagnosis not present

## 2016-12-06 DIAGNOSIS — M25552 Pain in left hip: Secondary | ICD-10-CM | POA: Diagnosis not present

## 2016-12-06 DIAGNOSIS — I10 Essential (primary) hypertension: Secondary | ICD-10-CM | POA: Diagnosis not present

## 2016-12-06 DIAGNOSIS — M25561 Pain in right knee: Secondary | ICD-10-CM | POA: Diagnosis not present

## 2016-12-06 DIAGNOSIS — M25612 Stiffness of left shoulder, not elsewhere classified: Secondary | ICD-10-CM | POA: Diagnosis not present

## 2016-12-06 DIAGNOSIS — E119 Type 2 diabetes mellitus without complications: Secondary | ICD-10-CM | POA: Diagnosis not present

## 2016-12-06 DIAGNOSIS — M62412 Contracture of muscle, left shoulder: Secondary | ICD-10-CM | POA: Diagnosis not present

## 2016-12-06 DIAGNOSIS — M62512 Muscle wasting and atrophy, not elsewhere classified, left shoulder: Secondary | ICD-10-CM | POA: Diagnosis not present

## 2016-12-06 DIAGNOSIS — M25551 Pain in right hip: Secondary | ICD-10-CM | POA: Diagnosis not present

## 2016-12-06 DIAGNOSIS — Z96612 Presence of left artificial shoulder joint: Secondary | ICD-10-CM | POA: Diagnosis not present

## 2016-12-08 DIAGNOSIS — M25512 Pain in left shoulder: Secondary | ICD-10-CM | POA: Diagnosis not present

## 2016-12-08 DIAGNOSIS — M62412 Contracture of muscle, left shoulder: Secondary | ICD-10-CM | POA: Diagnosis not present

## 2016-12-08 DIAGNOSIS — M150 Primary generalized (osteo)arthritis: Secondary | ICD-10-CM | POA: Diagnosis not present

## 2016-12-08 DIAGNOSIS — M25552 Pain in left hip: Secondary | ICD-10-CM | POA: Diagnosis not present

## 2016-12-08 DIAGNOSIS — M25561 Pain in right knee: Secondary | ICD-10-CM | POA: Diagnosis not present

## 2016-12-08 DIAGNOSIS — I10 Essential (primary) hypertension: Secondary | ICD-10-CM | POA: Diagnosis not present

## 2016-12-08 DIAGNOSIS — Z96612 Presence of left artificial shoulder joint: Secondary | ICD-10-CM | POA: Diagnosis not present

## 2016-12-08 DIAGNOSIS — E119 Type 2 diabetes mellitus without complications: Secondary | ICD-10-CM | POA: Diagnosis not present

## 2016-12-08 DIAGNOSIS — M62512 Muscle wasting and atrophy, not elsewhere classified, left shoulder: Secondary | ICD-10-CM | POA: Diagnosis not present

## 2016-12-08 DIAGNOSIS — M25612 Stiffness of left shoulder, not elsewhere classified: Secondary | ICD-10-CM | POA: Diagnosis not present

## 2016-12-08 DIAGNOSIS — M25551 Pain in right hip: Secondary | ICD-10-CM | POA: Diagnosis not present

## 2016-12-13 DIAGNOSIS — M25552 Pain in left hip: Secondary | ICD-10-CM | POA: Diagnosis not present

## 2016-12-13 DIAGNOSIS — M62412 Contracture of muscle, left shoulder: Secondary | ICD-10-CM | POA: Diagnosis not present

## 2016-12-13 DIAGNOSIS — I10 Essential (primary) hypertension: Secondary | ICD-10-CM | POA: Diagnosis not present

## 2016-12-13 DIAGNOSIS — M25612 Stiffness of left shoulder, not elsewhere classified: Secondary | ICD-10-CM | POA: Diagnosis not present

## 2016-12-13 DIAGNOSIS — M25512 Pain in left shoulder: Secondary | ICD-10-CM | POA: Diagnosis not present

## 2016-12-13 DIAGNOSIS — M25561 Pain in right knee: Secondary | ICD-10-CM | POA: Diagnosis not present

## 2016-12-13 DIAGNOSIS — M62512 Muscle wasting and atrophy, not elsewhere classified, left shoulder: Secondary | ICD-10-CM | POA: Diagnosis not present

## 2016-12-13 DIAGNOSIS — Z96612 Presence of left artificial shoulder joint: Secondary | ICD-10-CM | POA: Diagnosis not present

## 2016-12-13 DIAGNOSIS — E119 Type 2 diabetes mellitus without complications: Secondary | ICD-10-CM | POA: Diagnosis not present

## 2016-12-13 DIAGNOSIS — M150 Primary generalized (osteo)arthritis: Secondary | ICD-10-CM | POA: Diagnosis not present

## 2016-12-13 DIAGNOSIS — M25551 Pain in right hip: Secondary | ICD-10-CM | POA: Diagnosis not present

## 2016-12-14 DIAGNOSIS — Z125 Encounter for screening for malignant neoplasm of prostate: Secondary | ICD-10-CM | POA: Diagnosis not present

## 2016-12-14 DIAGNOSIS — E6609 Other obesity due to excess calories: Secondary | ICD-10-CM | POA: Diagnosis not present

## 2016-12-14 DIAGNOSIS — Z23 Encounter for immunization: Secondary | ICD-10-CM | POA: Diagnosis not present

## 2016-12-14 DIAGNOSIS — Z1389 Encounter for screening for other disorder: Secondary | ICD-10-CM | POA: Diagnosis not present

## 2016-12-14 DIAGNOSIS — Z6830 Body mass index (BMI) 30.0-30.9, adult: Secondary | ICD-10-CM | POA: Diagnosis not present

## 2016-12-14 DIAGNOSIS — E039 Hypothyroidism, unspecified: Secondary | ICD-10-CM | POA: Diagnosis not present

## 2016-12-15 DIAGNOSIS — I10 Essential (primary) hypertension: Secondary | ICD-10-CM | POA: Diagnosis not present

## 2016-12-15 DIAGNOSIS — M25551 Pain in right hip: Secondary | ICD-10-CM | POA: Diagnosis not present

## 2016-12-15 DIAGNOSIS — M62512 Muscle wasting and atrophy, not elsewhere classified, left shoulder: Secondary | ICD-10-CM | POA: Diagnosis not present

## 2016-12-15 DIAGNOSIS — Z96612 Presence of left artificial shoulder joint: Secondary | ICD-10-CM | POA: Diagnosis not present

## 2016-12-15 DIAGNOSIS — M25561 Pain in right knee: Secondary | ICD-10-CM | POA: Diagnosis not present

## 2016-12-15 DIAGNOSIS — M25552 Pain in left hip: Secondary | ICD-10-CM | POA: Diagnosis not present

## 2016-12-15 DIAGNOSIS — M25612 Stiffness of left shoulder, not elsewhere classified: Secondary | ICD-10-CM | POA: Diagnosis not present

## 2016-12-15 DIAGNOSIS — M25512 Pain in left shoulder: Secondary | ICD-10-CM | POA: Diagnosis not present

## 2016-12-15 DIAGNOSIS — M150 Primary generalized (osteo)arthritis: Secondary | ICD-10-CM | POA: Diagnosis not present

## 2016-12-15 DIAGNOSIS — M62412 Contracture of muscle, left shoulder: Secondary | ICD-10-CM | POA: Diagnosis not present

## 2016-12-15 DIAGNOSIS — E119 Type 2 diabetes mellitus without complications: Secondary | ICD-10-CM | POA: Diagnosis not present

## 2016-12-21 DIAGNOSIS — I10 Essential (primary) hypertension: Secondary | ICD-10-CM | POA: Diagnosis not present

## 2016-12-21 DIAGNOSIS — M62512 Muscle wasting and atrophy, not elsewhere classified, left shoulder: Secondary | ICD-10-CM | POA: Diagnosis not present

## 2016-12-21 DIAGNOSIS — M25561 Pain in right knee: Secondary | ICD-10-CM | POA: Diagnosis not present

## 2016-12-21 DIAGNOSIS — M25512 Pain in left shoulder: Secondary | ICD-10-CM | POA: Diagnosis not present

## 2016-12-21 DIAGNOSIS — M25612 Stiffness of left shoulder, not elsewhere classified: Secondary | ICD-10-CM | POA: Diagnosis not present

## 2016-12-21 DIAGNOSIS — M25551 Pain in right hip: Secondary | ICD-10-CM | POA: Diagnosis not present

## 2016-12-21 DIAGNOSIS — M62412 Contracture of muscle, left shoulder: Secondary | ICD-10-CM | POA: Diagnosis not present

## 2016-12-21 DIAGNOSIS — E119 Type 2 diabetes mellitus without complications: Secondary | ICD-10-CM | POA: Diagnosis not present

## 2016-12-21 DIAGNOSIS — Z96612 Presence of left artificial shoulder joint: Secondary | ICD-10-CM | POA: Diagnosis not present

## 2016-12-21 DIAGNOSIS — M25552 Pain in left hip: Secondary | ICD-10-CM | POA: Diagnosis not present

## 2016-12-21 DIAGNOSIS — D649 Anemia, unspecified: Secondary | ICD-10-CM | POA: Diagnosis not present

## 2016-12-21 DIAGNOSIS — M150 Primary generalized (osteo)arthritis: Secondary | ICD-10-CM | POA: Diagnosis not present

## 2016-12-23 DIAGNOSIS — Z1211 Encounter for screening for malignant neoplasm of colon: Secondary | ICD-10-CM | POA: Diagnosis not present

## 2016-12-23 DIAGNOSIS — M62512 Muscle wasting and atrophy, not elsewhere classified, left shoulder: Secondary | ICD-10-CM | POA: Diagnosis not present

## 2016-12-23 DIAGNOSIS — M25551 Pain in right hip: Secondary | ICD-10-CM | POA: Diagnosis not present

## 2016-12-23 DIAGNOSIS — M25512 Pain in left shoulder: Secondary | ICD-10-CM | POA: Diagnosis not present

## 2016-12-23 DIAGNOSIS — M25552 Pain in left hip: Secondary | ICD-10-CM | POA: Diagnosis not present

## 2016-12-23 DIAGNOSIS — M25561 Pain in right knee: Secondary | ICD-10-CM | POA: Diagnosis not present

## 2016-12-23 DIAGNOSIS — M150 Primary generalized (osteo)arthritis: Secondary | ICD-10-CM | POA: Diagnosis not present

## 2016-12-23 DIAGNOSIS — M62412 Contracture of muscle, left shoulder: Secondary | ICD-10-CM | POA: Diagnosis not present

## 2016-12-23 DIAGNOSIS — M25612 Stiffness of left shoulder, not elsewhere classified: Secondary | ICD-10-CM | POA: Diagnosis not present

## 2016-12-23 DIAGNOSIS — I10 Essential (primary) hypertension: Secondary | ICD-10-CM | POA: Diagnosis not present

## 2016-12-23 DIAGNOSIS — E119 Type 2 diabetes mellitus without complications: Secondary | ICD-10-CM | POA: Diagnosis not present

## 2016-12-23 DIAGNOSIS — Z96612 Presence of left artificial shoulder joint: Secondary | ICD-10-CM | POA: Diagnosis not present

## 2016-12-27 DIAGNOSIS — M25561 Pain in right knee: Secondary | ICD-10-CM | POA: Diagnosis not present

## 2016-12-27 DIAGNOSIS — I10 Essential (primary) hypertension: Secondary | ICD-10-CM | POA: Diagnosis not present

## 2016-12-27 DIAGNOSIS — M25551 Pain in right hip: Secondary | ICD-10-CM | POA: Diagnosis not present

## 2016-12-27 DIAGNOSIS — E119 Type 2 diabetes mellitus without complications: Secondary | ICD-10-CM | POA: Diagnosis not present

## 2016-12-27 DIAGNOSIS — M25612 Stiffness of left shoulder, not elsewhere classified: Secondary | ICD-10-CM | POA: Diagnosis not present

## 2016-12-27 DIAGNOSIS — M62512 Muscle wasting and atrophy, not elsewhere classified, left shoulder: Secondary | ICD-10-CM | POA: Diagnosis not present

## 2016-12-27 DIAGNOSIS — M25512 Pain in left shoulder: Secondary | ICD-10-CM | POA: Diagnosis not present

## 2016-12-27 DIAGNOSIS — M150 Primary generalized (osteo)arthritis: Secondary | ICD-10-CM | POA: Diagnosis not present

## 2016-12-27 DIAGNOSIS — Z96612 Presence of left artificial shoulder joint: Secondary | ICD-10-CM | POA: Diagnosis not present

## 2016-12-27 DIAGNOSIS — M62412 Contracture of muscle, left shoulder: Secondary | ICD-10-CM | POA: Diagnosis not present

## 2016-12-27 DIAGNOSIS — M25552 Pain in left hip: Secondary | ICD-10-CM | POA: Diagnosis not present

## 2016-12-29 DIAGNOSIS — M25512 Pain in left shoulder: Secondary | ICD-10-CM | POA: Diagnosis not present

## 2016-12-29 DIAGNOSIS — M25612 Stiffness of left shoulder, not elsewhere classified: Secondary | ICD-10-CM | POA: Diagnosis not present

## 2016-12-29 DIAGNOSIS — M62412 Contracture of muscle, left shoulder: Secondary | ICD-10-CM | POA: Diagnosis not present

## 2016-12-29 DIAGNOSIS — M25561 Pain in right knee: Secondary | ICD-10-CM | POA: Diagnosis not present

## 2016-12-29 DIAGNOSIS — M25552 Pain in left hip: Secondary | ICD-10-CM | POA: Diagnosis not present

## 2016-12-29 DIAGNOSIS — E119 Type 2 diabetes mellitus without complications: Secondary | ICD-10-CM | POA: Diagnosis not present

## 2016-12-29 DIAGNOSIS — M25551 Pain in right hip: Secondary | ICD-10-CM | POA: Diagnosis not present

## 2016-12-29 DIAGNOSIS — Z96612 Presence of left artificial shoulder joint: Secondary | ICD-10-CM | POA: Diagnosis not present

## 2016-12-29 DIAGNOSIS — I10 Essential (primary) hypertension: Secondary | ICD-10-CM | POA: Diagnosis not present

## 2016-12-29 DIAGNOSIS — M150 Primary generalized (osteo)arthritis: Secondary | ICD-10-CM | POA: Diagnosis not present

## 2016-12-29 DIAGNOSIS — M62512 Muscle wasting and atrophy, not elsewhere classified, left shoulder: Secondary | ICD-10-CM | POA: Diagnosis not present

## 2017-01-19 DIAGNOSIS — D649 Anemia, unspecified: Secondary | ICD-10-CM | POA: Diagnosis not present

## 2017-01-31 DIAGNOSIS — Z471 Aftercare following joint replacement surgery: Secondary | ICD-10-CM | POA: Diagnosis not present

## 2017-01-31 DIAGNOSIS — Z96612 Presence of left artificial shoulder joint: Secondary | ICD-10-CM | POA: Diagnosis not present

## 2017-02-20 DIAGNOSIS — R109 Unspecified abdominal pain: Secondary | ICD-10-CM | POA: Diagnosis not present

## 2017-02-20 DIAGNOSIS — R197 Diarrhea, unspecified: Secondary | ICD-10-CM | POA: Diagnosis not present

## 2017-02-20 DIAGNOSIS — R1030 Lower abdominal pain, unspecified: Secondary | ICD-10-CM | POA: Diagnosis not present

## 2017-03-15 DIAGNOSIS — M542 Cervicalgia: Secondary | ICD-10-CM | POA: Diagnosis not present

## 2017-03-15 DIAGNOSIS — Z96612 Presence of left artificial shoulder joint: Secondary | ICD-10-CM | POA: Diagnosis not present

## 2017-03-15 DIAGNOSIS — Z471 Aftercare following joint replacement surgery: Secondary | ICD-10-CM | POA: Diagnosis not present

## 2017-04-20 DIAGNOSIS — Z1389 Encounter for screening for other disorder: Secondary | ICD-10-CM | POA: Diagnosis not present

## 2017-04-20 DIAGNOSIS — R0981 Nasal congestion: Secondary | ICD-10-CM | POA: Diagnosis not present

## 2017-04-20 DIAGNOSIS — E669 Obesity, unspecified: Secondary | ICD-10-CM | POA: Diagnosis not present

## 2017-04-20 DIAGNOSIS — E6609 Other obesity due to excess calories: Secondary | ICD-10-CM | POA: Diagnosis not present

## 2017-04-20 DIAGNOSIS — I10 Essential (primary) hypertension: Secondary | ICD-10-CM | POA: Diagnosis not present

## 2017-04-20 DIAGNOSIS — J069 Acute upper respiratory infection, unspecified: Secondary | ICD-10-CM | POA: Diagnosis not present

## 2017-04-20 DIAGNOSIS — E782 Mixed hyperlipidemia: Secondary | ICD-10-CM | POA: Diagnosis not present

## 2017-04-20 DIAGNOSIS — R05 Cough: Secondary | ICD-10-CM | POA: Diagnosis not present

## 2017-04-20 DIAGNOSIS — E1165 Type 2 diabetes mellitus with hyperglycemia: Secondary | ICD-10-CM | POA: Diagnosis not present

## 2017-04-20 DIAGNOSIS — Z6830 Body mass index (BMI) 30.0-30.9, adult: Secondary | ICD-10-CM | POA: Diagnosis not present

## 2017-08-08 DIAGNOSIS — E119 Type 2 diabetes mellitus without complications: Secondary | ICD-10-CM | POA: Diagnosis not present

## 2017-08-08 DIAGNOSIS — E782 Mixed hyperlipidemia: Secondary | ICD-10-CM | POA: Diagnosis not present

## 2017-08-08 DIAGNOSIS — E6609 Other obesity due to excess calories: Secondary | ICD-10-CM | POA: Diagnosis not present

## 2017-08-08 DIAGNOSIS — I10 Essential (primary) hypertension: Secondary | ICD-10-CM | POA: Diagnosis not present

## 2017-08-08 DIAGNOSIS — Z6830 Body mass index (BMI) 30.0-30.9, adult: Secondary | ICD-10-CM | POA: Diagnosis not present

## 2017-08-09 DIAGNOSIS — C44511 Basal cell carcinoma of skin of breast: Secondary | ICD-10-CM | POA: Diagnosis not present

## 2017-08-09 DIAGNOSIS — L570 Actinic keratosis: Secondary | ICD-10-CM | POA: Diagnosis not present

## 2017-08-09 DIAGNOSIS — C44519 Basal cell carcinoma of skin of other part of trunk: Secondary | ICD-10-CM | POA: Diagnosis not present

## 2017-08-09 DIAGNOSIS — X32XXXD Exposure to sunlight, subsequent encounter: Secondary | ICD-10-CM | POA: Diagnosis not present

## 2017-08-09 DIAGNOSIS — Z1283 Encounter for screening for malignant neoplasm of skin: Secondary | ICD-10-CM | POA: Diagnosis not present

## 2017-08-09 DIAGNOSIS — D225 Melanocytic nevi of trunk: Secondary | ICD-10-CM | POA: Diagnosis not present

## 2017-09-20 DIAGNOSIS — L570 Actinic keratosis: Secondary | ICD-10-CM | POA: Diagnosis not present

## 2017-09-20 DIAGNOSIS — L821 Other seborrheic keratosis: Secondary | ICD-10-CM | POA: Diagnosis not present

## 2017-09-20 DIAGNOSIS — X32XXXD Exposure to sunlight, subsequent encounter: Secondary | ICD-10-CM | POA: Diagnosis not present

## 2017-09-20 DIAGNOSIS — Z85828 Personal history of other malignant neoplasm of skin: Secondary | ICD-10-CM | POA: Diagnosis not present

## 2017-09-20 DIAGNOSIS — Z08 Encounter for follow-up examination after completed treatment for malignant neoplasm: Secondary | ICD-10-CM | POA: Diagnosis not present

## 2017-09-28 DIAGNOSIS — Z471 Aftercare following joint replacement surgery: Secondary | ICD-10-CM | POA: Diagnosis not present

## 2017-09-28 DIAGNOSIS — Z96612 Presence of left artificial shoulder joint: Secondary | ICD-10-CM | POA: Diagnosis not present

## 2017-10-03 DIAGNOSIS — Z96651 Presence of right artificial knee joint: Secondary | ICD-10-CM | POA: Diagnosis not present

## 2017-10-03 DIAGNOSIS — M7631 Iliotibial band syndrome, right leg: Secondary | ICD-10-CM | POA: Diagnosis not present

## 2017-10-13 DIAGNOSIS — Z0000 Encounter for general adult medical examination without abnormal findings: Secondary | ICD-10-CM | POA: Diagnosis not present

## 2017-10-13 DIAGNOSIS — Z6831 Body mass index (BMI) 31.0-31.9, adult: Secondary | ICD-10-CM | POA: Diagnosis not present

## 2017-10-13 DIAGNOSIS — R351 Nocturia: Secondary | ICD-10-CM | POA: Diagnosis not present

## 2017-10-13 DIAGNOSIS — E1165 Type 2 diabetes mellitus with hyperglycemia: Secondary | ICD-10-CM | POA: Diagnosis not present

## 2017-10-13 DIAGNOSIS — Z1389 Encounter for screening for other disorder: Secondary | ICD-10-CM | POA: Diagnosis not present

## 2017-12-27 DIAGNOSIS — M47816 Spondylosis without myelopathy or radiculopathy, lumbar region: Secondary | ICD-10-CM | POA: Diagnosis not present

## 2017-12-27 DIAGNOSIS — S233XXA Sprain of ligaments of thoracic spine, initial encounter: Secondary | ICD-10-CM | POA: Diagnosis not present

## 2017-12-27 DIAGNOSIS — M9903 Segmental and somatic dysfunction of lumbar region: Secondary | ICD-10-CM | POA: Diagnosis not present

## 2017-12-27 DIAGNOSIS — M47812 Spondylosis without myelopathy or radiculopathy, cervical region: Secondary | ICD-10-CM | POA: Diagnosis not present

## 2017-12-27 DIAGNOSIS — M9901 Segmental and somatic dysfunction of cervical region: Secondary | ICD-10-CM | POA: Diagnosis not present

## 2017-12-27 DIAGNOSIS — M9902 Segmental and somatic dysfunction of thoracic region: Secondary | ICD-10-CM | POA: Diagnosis not present

## 2018-02-09 DIAGNOSIS — H524 Presbyopia: Secondary | ICD-10-CM | POA: Diagnosis not present

## 2018-02-23 DIAGNOSIS — M47816 Spondylosis without myelopathy or radiculopathy, lumbar region: Secondary | ICD-10-CM | POA: Diagnosis not present

## 2018-02-23 DIAGNOSIS — M47812 Spondylosis without myelopathy or radiculopathy, cervical region: Secondary | ICD-10-CM | POA: Diagnosis not present

## 2018-02-23 DIAGNOSIS — M9902 Segmental and somatic dysfunction of thoracic region: Secondary | ICD-10-CM | POA: Diagnosis not present

## 2018-02-23 DIAGNOSIS — M9901 Segmental and somatic dysfunction of cervical region: Secondary | ICD-10-CM | POA: Diagnosis not present

## 2018-02-23 DIAGNOSIS — S233XXA Sprain of ligaments of thoracic spine, initial encounter: Secondary | ICD-10-CM | POA: Diagnosis not present

## 2018-02-23 DIAGNOSIS — M9903 Segmental and somatic dysfunction of lumbar region: Secondary | ICD-10-CM | POA: Diagnosis not present

## 2018-02-24 DIAGNOSIS — M531 Cervicobrachial syndrome: Secondary | ICD-10-CM | POA: Diagnosis not present

## 2018-02-24 DIAGNOSIS — M9901 Segmental and somatic dysfunction of cervical region: Secondary | ICD-10-CM | POA: Diagnosis not present

## 2018-02-24 DIAGNOSIS — M47812 Spondylosis without myelopathy or radiculopathy, cervical region: Secondary | ICD-10-CM | POA: Diagnosis not present

## 2018-02-27 DIAGNOSIS — M47812 Spondylosis without myelopathy or radiculopathy, cervical region: Secondary | ICD-10-CM | POA: Diagnosis not present

## 2018-02-27 DIAGNOSIS — M531 Cervicobrachial syndrome: Secondary | ICD-10-CM | POA: Diagnosis not present

## 2018-02-27 DIAGNOSIS — M9901 Segmental and somatic dysfunction of cervical region: Secondary | ICD-10-CM | POA: Diagnosis not present

## 2018-03-21 DIAGNOSIS — L820 Inflamed seborrheic keratosis: Secondary | ICD-10-CM | POA: Diagnosis not present

## 2018-03-21 DIAGNOSIS — L570 Actinic keratosis: Secondary | ICD-10-CM | POA: Diagnosis not present

## 2018-03-21 DIAGNOSIS — Z08 Encounter for follow-up examination after completed treatment for malignant neoplasm: Secondary | ICD-10-CM | POA: Diagnosis not present

## 2018-03-21 DIAGNOSIS — Z85828 Personal history of other malignant neoplasm of skin: Secondary | ICD-10-CM | POA: Diagnosis not present

## 2018-03-21 DIAGNOSIS — X32XXXD Exposure to sunlight, subsequent encounter: Secondary | ICD-10-CM | POA: Diagnosis not present

## 2018-03-26 NOTE — Progress Notes (Signed)
Cardiology Office Note   Date:  03/27/2018   ID:  Reginald Chambers, DOB 1945/12/09, MRN [id number]  PCP:  Redmond School, MD  Cardiologist:  Dr. Martinique   Chief Complaint  Patient presents with  . Hypertension  . Shortness of Breath   History of Present Illness: Reginald Chambers is a 72 y.o. male who presents for ongoing assessment and management of hypertension. The patient has not been seen in the office since 04/14/2016 at which time he was having preoperative cardiology evaluation for left shoulder surgery. Other history includes Type II Diabetes and hypercholesterolemia.   NM 02/26/2011   Normal stress nuclear myocardial study revealing adequate exercise tolerance, borderline left ventricular dilatation with preserved systolic function and perhaps a small segmental wall motion abnormality, a negative stress EKG and normal myocardial perfusion without evidence for ischemia or infarction.  Other findings as Noted.  The patient normally plays golf every day.  He walks 18 holes one day and then the following date is 9 holes, and repeats this throughout the week.  The patient is recently returned from a cruise approximately 2 and half weeks ago.  He states when he arrived home he began to have trouble taking deep breaths, coughing a lot and has been unable to walk like he likes to during playing golf often having to get a golf cart.  Due to these changes he requested cardiology to evaluate him further.  He denies chest pain, dizziness, orthopnea, or PND.  He denies any edema.  He is normally very active but is unhappy with the changes in his stamina lately.  Since being seen last she has had no new diagnoses, no hospitalizations, no new allergies, and no ER visits. Past Medical History:  Diagnosis Date  . Cancer ([place])    skin cancer- basal cell CA  . Essential hypertension, benign   . GERD (gastroesophageal reflux disease)    pt. denies  . Hard of hearing   . Heart murmur    at  birth  . History of agent Orange exposure    [place]  . History of skin cancer   . Malaria   . Mixed hyperlipidemia   . Nocturia   . Osteoarthritis   . Personal history of colonic polyps - adenoma 04/02/2009  . S/P colonoscopy May 2010   68mm sessile polyp, adenoma in ascending colon, mild diverticulosis in sigmoid, internal hemorrhoids, needs repeat in 2015. Performed by Dr. Carlean Purl  . Tinnitus    SINCE 1968  . Type 2 diabetes mellitus ([place])     Past Surgical History:  Procedure Laterality Date  . APPENDECTOMY  1959  . BILATERAL KNEE ARTHROSCOPY    . COLONOSCOPY  04/02/09   7 mm sessile polyp ascending colon/internal hemmorrhoids/mild diverticulosis sigmoid colon  . EYE SURGERY Bilateral    cataracts  . REVERSE SHOULDER ARTHROPLASTY Left 11/04/2016  . REVERSE SHOULDER ARTHROPLASTY Left 11/04/2016   Procedure: LEFT REVERSE SHOULDER ARTHROPLASTY;  Surgeon: Justice Britain, MD;  Location: [facility];  Service: Orthopedics;  Laterality: Left;  . right shoulder arthroscopy    . ROTATOR CUFF REPAIR  1990   Left shoulder  . TOTAL KNEE ARTHROPLASTY  2007   Left knee  . TOTAL KNEE ARTHROPLASTY Right 12/24/2014   Procedure: RIGHT TOTAL KNEE ARTHROPLASTY;  Surgeon: Sydnee Cabal, MD;  Location: [facility];  Service: Orthopedics;  Laterality: Right;     Current Outpatient Medications  Medication Sig Dispense Refill  . aspirin 81 MG tablet Take 81 mg by  mouth 2 (two) times daily.    . canagliflozin (INVOKANA) 300 MG TABS tablet Take 300 mg by mouth every evening.    [name] glipiZIDE (GLUCOTROL) 10 MG tablet Take 15 mg by mouth daily before breakfast.    . metFORMIN (GLUCOPHAGE) 1000 MG tablet Take 1,000 mg by mouth 2 (two) times daily.  0  . Omega-3 Fatty Acids (FISH OIL) 1000 MG CAPS Take 1,000 mg by mouth daily.    [name] oxyCODONE-acetaminophen (PERCOCET) 5-325 MG tablet Take 1-2 tablets by mouth every 4 (four) hours as needed. 40 tablet 0  . Polyvinyl Alcohol-Povidone (CLEAR EYES NATURAL TEARS OP) Apply 2  drops to eye daily as needed (dry eyes).    . simvastatin (ZOCOR) 40 MG tablet Take 40 mg by mouth at bedtime.     [name] telmisartan-hydrochlorothiazide (MICARDIS HCT) 40-12.5 MG per tablet Take 1 tablet by mouth every morning.      No current facility-administered medications for this visit.     Allergies:   Penicillins    Social History:  The patient  reports that he quit smoking about 27 years ago. His smoking use included cigarettes. He has a 5.00 pack-year smoking history. He has never used smokeless tobacco. He reports that he drinks about 1.2 oz of alcohol per week. He reports that he does not use drugs.   Family History:  The patient's family history includes Diabetes type II in his mother; Heart failure in his mother; Lymphoma in his father.    ROS: All other systems are reviewed and negative. Unless otherwise mentioned in H&P    PHYSICAL EXAM: VS:  Ht 5\' 9"  (1.753 m)   Wt 206 lb (93.4 kg)   BMI 30.42 kg/m  , BMI Body mass index is 30.42 kg/m. GEN: Well nourished, well developed, in no acute distress  HEENT: normal  Neck: no JVD, carotid bruits, or masses Cardiac: RRR; no murmurs, rubs, or gallops,no edema  Respiratory: Bibasilar crackles, cleared with coughing. There is no wheezing.  GI: soft, nontender, nondistended, + BS MS: no deformity or atrophy  Skin: warm and dry, no rash Neuro:  Strength and sensation are intact Psych: euthymic mood, full affect   EKG: Sinus bradycardia, heart rate of 59 bpm.  No evidence of ischemia, or ST-T wave changes.  Recent Labs: Requesting from PCP who has completed these within the last 2 months.  Dr. Gerarda Fraction.    Lipid Panel No results found for: CHOL, TRIG, HDL, CHOLHDL, VLDL, LDLCALC, LDLDIRECT    Wt Readings from Last 3 Encounters:  03/27/18 206 lb (93.4 kg)  11/04/16 212 lb 11.2 oz (96.5 kg)  10/27/16 212 lb 11.2 oz (96.5 kg)      Other studies Reviewed: As above   ASSESSMENT AND PLAN:  1.  Dyspnea on exertion: The  patient normally walks a good bit each day.  He plays golf daily but has not been able to walk the course as he usually does requiring a golf cart about halfway through.  He has noted worsening dyspnea and inability to take a deep breath without having a cough.  At first he believed it to be a high pollen count after returning from a cruise, but due to these changes he wished to be evaluated further.  I am hearing some bibasilar crackles, which for the most part clear with coughing but not entirely.  On review chest x-ray to evaluate for any changes compared to prior chest x-rays.  We will also get a nuclear medicine stress  Myoview for diagnostic prognostic purposes.  His last evaluation was 2012.May consider echo if chest X-Ray is ok.   2.  Hypercholesterolemia: He is followed by his primary care physician and is currently on fish oil and simvastatin.  I am requesting records from his recent labs.  3.  Diabetes: He states that his primary care has been monitoring his diabetes and he also has gone to a nutritionist at the first of the year, his most recent hemoglobin A1c was 7.0.  Would recommend Jardiance, or another SGPT 2 for cardiac protection.  Current medicines are reviewed at length with the patient today.    Labs/ tests ordered today include: Chest x-ray, nuclear medicine stress Myoview.  Phill Myron. West Pugh, ANP, AACC   03/27/2018 9:48 AM    [facility] [address], [place], [place] [address] Phone: [phone]; Fax: [phone]

## 2018-03-27 ENCOUNTER — Encounter: Payer: Self-pay | Admitting: Adult Health

## 2018-03-27 ENCOUNTER — Ambulatory Visit (INDEPENDENT_AMBULATORY_CARE_PROVIDER_SITE_OTHER): Payer: PPO | Admitting: Adult Health

## 2018-03-27 ENCOUNTER — Ambulatory Visit
Admission: RE | Admit: 2018-03-27 | Discharge: 2018-03-27 | Disposition: A | Discharge status: Home / Self Care | Payer: PPO | Source: Ambulatory Visit | Attending: Adult Health | Admitting: Adult Health

## 2018-03-27 VITALS — BP 136/72 | HR 59 | Ht 69.0 in | Wt 206.0 lb

## 2018-03-27 DIAGNOSIS — R059 Cough, unspecified: Secondary | ICD-10-CM

## 2018-03-27 DIAGNOSIS — R072 Precordial pain: Secondary | ICD-10-CM | POA: Diagnosis not present

## 2018-03-27 DIAGNOSIS — R05 Cough: Secondary | ICD-10-CM

## 2018-03-27 DIAGNOSIS — R0989 Other specified symptoms and signs involving the circulatory and respiratory systems: Secondary | ICD-10-CM

## 2018-03-27 DIAGNOSIS — R0789 Other chest pain: Secondary | ICD-10-CM

## 2018-03-27 DIAGNOSIS — R0602 Shortness of breath: Secondary | ICD-10-CM | POA: Diagnosis not present

## 2018-03-27 DIAGNOSIS — Z79899 Other long term (current) drug therapy: Secondary | ICD-10-CM | POA: Diagnosis not present

## 2018-03-27 NOTE — Addendum Note (Signed)
Addended by: Waylan Rocher on: 03/27/2018 10:34 AM   Modules accepted: Orders

## 2018-03-27 NOTE — Patient Instructions (Addendum)
Medication Instructions:  NO CHANGES- Your physician recommends that you continue on your current medications as directed. Please refer to the Current Medication list given to you today.  If you need a refill on your cardiac medications before your next appointment, please call your pharmacy.  Testing/Procedures: Your physician has requested that you have an Exercise Myoview. A cardiac stress test is a cardiological test that measures the heart's ability to respond to external stress in a controlled clinical environment. The stress response is induced by exercise (exercise-treadmill) or by intravenous pharmacological stimulation   For further information please visit [email or web address]. Please follow instructions below.  How to prepare for your Myocardial Perfusion Test:  Hold: nothing the day of the testing; take all medications before the test  Do not eat or drink 3 hours prior to your test, except you may have water.  Do not consume products containing caffeine (regular or decaffeinated) 12 hours prior to your test. (ex: coffee, chocolate, sodas, tea).  Do wear comfortable clothes (no dresses or overalls) and walking shoes, tennis shoes preferred (No heels or open toe shoes are allowed).  Do NOT wear cologne, perfume, aftershave, or lotions (deodorant is allowed).  If these instructions are not followed, your test will have to be rescheduled.  If you have questions or concerns about your appointment, you can call the Nuclear Lab at [phone].  A chest x-ray takes a picture of the organs and structures inside the chest, including the heart, lungs, and blood vessels. This test can show several things, including, whether the heart is enlarges; whether fluid is building up in the lungs; and whether pacemaker / defibrillator leads are still in place.  Follow-Up: Your physician wants you to follow-up in: after testing with dr Martinique -OR- KATHRYN LAWRENCE (NURSE PRACTIONIER), DNP,AACC IF  PRIMARY CARDIOLOGIST IS UNAVAILABLE.    Thank you for choosing [facility] at [facility]!!      ADD-ON AFTER-FLP,LFT, BMET

## 2018-03-28 ENCOUNTER — Other Ambulatory Visit: Payer: Self-pay

## 2018-03-28 DIAGNOSIS — R0600 Dyspnea, unspecified: Secondary | ICD-10-CM

## 2018-03-30 ENCOUNTER — Telehealth (HOSPITAL_COMMUNITY): Payer: Self-pay

## 2018-03-30 NOTE — Telephone Encounter (Signed)
Encounter complete. 

## 2018-04-03 ENCOUNTER — Other Ambulatory Visit: Payer: Self-pay

## 2018-04-03 ENCOUNTER — Ambulatory Visit (HOSPITAL_BASED_OUTPATIENT_CLINIC_OR_DEPARTMENT_OTHER): Payer: PPO

## 2018-04-03 DIAGNOSIS — R0989 Other specified symptoms and signs involving the circulatory and respiratory systems: Secondary | ICD-10-CM | POA: Insufficient documentation

## 2018-04-03 DIAGNOSIS — E119 Type 2 diabetes mellitus without complications: Secondary | ICD-10-CM | POA: Insufficient documentation

## 2018-04-03 DIAGNOSIS — Z8249 Family history of ischemic heart disease and other diseases of the circulatory system: Secondary | ICD-10-CM | POA: Insufficient documentation

## 2018-04-03 DIAGNOSIS — R042 Hemoptysis: Secondary | ICD-10-CM | POA: Diagnosis not present

## 2018-04-03 DIAGNOSIS — Z77098 Contact with and (suspected) exposure to other hazardous, chiefly nonmedicinal, chemicals: Secondary | ICD-10-CM | POA: Diagnosis not present

## 2018-04-03 DIAGNOSIS — R0789 Other chest pain: Secondary | ICD-10-CM | POA: Diagnosis not present

## 2018-04-03 DIAGNOSIS — R011 Cardiac murmur, unspecified: Secondary | ICD-10-CM | POA: Insufficient documentation

## 2018-04-03 DIAGNOSIS — R0600 Dyspnea, unspecified: Secondary | ICD-10-CM | POA: Diagnosis not present

## 2018-04-03 DIAGNOSIS — E785 Hyperlipidemia, unspecified: Secondary | ICD-10-CM | POA: Diagnosis not present

## 2018-04-03 DIAGNOSIS — Z87891 Personal history of nicotine dependence: Secondary | ICD-10-CM | POA: Diagnosis not present

## 2018-04-04 ENCOUNTER — Ambulatory Visit (HOSPITAL_COMMUNITY)
Admission: RE | Admit: 2018-04-04 | Discharge: 2018-04-04 | Disposition: A | Discharge status: Home / Self Care | Payer: PPO | Source: Ambulatory Visit | Attending: Internal Medicine | Admitting: Internal Medicine

## 2018-04-04 DIAGNOSIS — R0789 Other chest pain: Secondary | ICD-10-CM | POA: Diagnosis not present

## 2018-04-04 DIAGNOSIS — R05 Cough: Secondary | ICD-10-CM | POA: Diagnosis not present

## 2018-04-04 DIAGNOSIS — R072 Precordial pain: Secondary | ICD-10-CM | POA: Diagnosis not present

## 2018-04-04 DIAGNOSIS — R0989 Other specified symptoms and signs involving the circulatory and respiratory systems: Secondary | ICD-10-CM

## 2018-04-04 DIAGNOSIS — R059 Cough, unspecified: Secondary | ICD-10-CM

## 2018-04-04 DIAGNOSIS — R0600 Dyspnea, unspecified: Secondary | ICD-10-CM | POA: Diagnosis not present

## 2018-04-04 LAB — MYOCARDIAL PERFUSION IMAGING
CHL CUP NUCLEAR SDS: 3
CHL CUP NUCLEAR SSS: 4
CHL RATE OF PERCEIVED EXERTION: 18
CSEPEW: 9.2 METS
Exercise duration (min): 7 min
Exercise duration (sec): 30 s
LV dias vol: 94 mL (ref 62–150)
LVSYSVOL: 46 mL
MPHR: 150 {beats}/min
NUC STRESS TID: 0.83
Peak HR: 153 {beats}/min
Percent HR: 102 %
Rest HR: 59 {beats}/min
SRS: 1

## 2018-04-04 IMAGING — NM NM MISC PROCEDURE
9 series · 54 of 54 positions shown · non-contrast
Comparison: none

[Series 1: wbr_r-proj_st wbr rest · 6.40mm/px · 6 of 64 frames shown]
[frame 6/64]
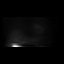
[frame 16/64]
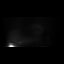
[frame 27/64]
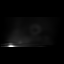
[frame 38/64]
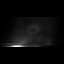
[frame 48/64]
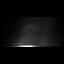
[frame 59/64]
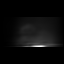

[Series 1: rest sax · 6.4mm · 6.40mm/px · 6 of 23 frames shown]
[frame 2/23]
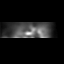
[frame 6/23]
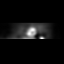
[frame 10/23]
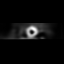
[frame 14/23]
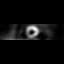
[frame 18/23]
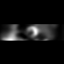
[frame 22/23]
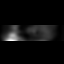

[Series 1: wbr rest · 6.40mm/px · 6 of 64 frames shown]
[frame 6/64]
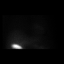
[frame 16/64]
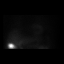
[frame 27/64]
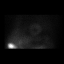
[frame 38/64]
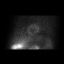
[frame 48/64]
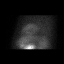
[frame 59/64]
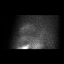

[Series 2: wbr_s-proj_st wbr stress-gsp · 6.40mm/px · 6 of 512 frames shown]
[frame 43/512]
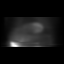
[frame 128/512]
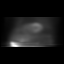
[frame 214/512]
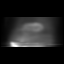
[frame 299/512]
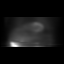
[frame 384/512]
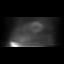
[frame 470/512]
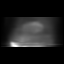

[Series 2: stress sax gs · 6.4mm · 6.40mm/px · 6 of 184 frames shown]
[frame 16/184]
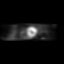
[frame 46/184]
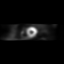
[frame 77/184]
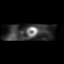
[frame 108/184]
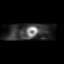
[frame 138/184]
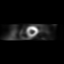
[frame 169/184]
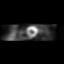

[Series 2: wbr stress-gsp · 6.40mm/px · 6 of 512 frames shown]
[frame 43/512]
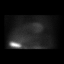
[frame 128/512]
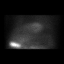
[frame 214/512]
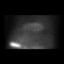
[frame 299/512]
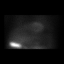
[frame 384/512]
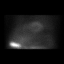
[frame 470/512]
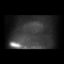

[Series 3: wbr stress-sum-em · 6.40mm/px · 6 of 64 frames shown]
[frame 6/64]
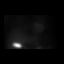
[frame 16/64]
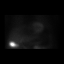
[frame 27/64]
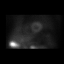
[frame 38/64]
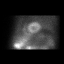
[frame 48/64]
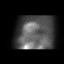
[frame 59/64]
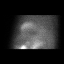

[Series 3: stress sax · 6.4mm · 6.40mm/px · 6 of 23 frames shown]
[frame 2/23]
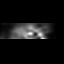
[frame 6/23]
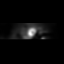
[frame 10/23]
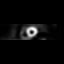
[frame 14/23]
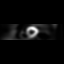
[frame 18/23]
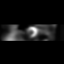
[frame 22/23]
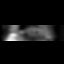

[Series 3: wbr_s-proj_st wbr stress-sum-em · 6.40mm/px · 6 of 64 frames shown]
[frame 6/64]
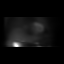
[frame 16/64]
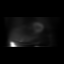
[frame 27/64]
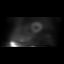
[frame 38/64]
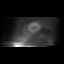
[frame 48/64]
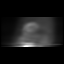
[frame 59/64]
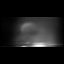

[54 of 54 positions shown; findings below may reference images not displayed]

Canned report from images found in remote index.

Refer to host system for actual result text.

## 2018-04-04 MED ORDER — TECHNETIUM TC 99M TETROFOSMIN IV KIT
30.8000 | PACK | Freq: Once | INTRAVENOUS | Status: AC | PRN
  Administered 2018-04-04: 30.8 via INTRAVENOUS
  Filled 2018-04-04: qty 31

## 2018-04-04 MED ORDER — TECHNETIUM TC 99M TETROFOSMIN IV KIT
10.6000 | PACK | Freq: Once | INTRAVENOUS | Status: AC | PRN
  Administered 2018-04-04: 10.6 via INTRAVENOUS
  Filled 2018-04-04: qty 11

## 2018-04-25 NOTE — Progress Notes (Signed)
Cardiology Office Note   Date:  04/26/2018   ID:  Reginald Chambers, DOB Feb 25, 1946, MRN [id number]  PCP:  Redmond School, MD  Cardiologist:  Dr. Martinique  Chief Complaint  Patient presents with  . Follow-up    DISCUSWS STRESS TEST AND COUGH X2 MO     History of Present Illness: Reginald Chambers is a 72 y.o. male who presents for ongoing assessment and management of of hypertension with other history of Type II Diabetes and hypercholesterolemia.  He was last seen  In the office on 03/27/2018. He is active and plays golf. Has been more fatigued doing so with shortness of breath, stopping and using golf cart more instead of walking the course. .   A stress myoview was ordered and completed on 04/04/2018.  Study Highlights     The left ventricular ejection fraction is mildly decreased (45-54%).  Nuclear stress EF: 51%.  Blood pressure demonstrated a normal response to exercise.  There was no ST segment deviation noted during stress.  The study is normal.  This is a low risk study.   Normal resting and stress perfusion. No ischemia or infarction EF 51%   He is without complaint today with the exception of a post nasal discharge cough. This is not a tickle cough or deep chest cough. He has sinus drainage and sometimes has a cough associated with this. He denies chest pain or worsening fatigue now   Past Medical History:  Diagnosis Date  . Cancer ([place])    skin cancer- basal cell CA  . Essential hypertension, benign   . GERD (gastroesophageal reflux disease)    pt. denies  . Hard of hearing   . Heart murmur    at birth  . History of agent Orange exposure    [place]  . History of skin cancer   . Malaria   . Mixed hyperlipidemia   . Nocturia   . Osteoarthritis   . Personal history of colonic polyps - adenoma 04/02/2009  . S/P colonoscopy May 2010   37mm sessile polyp, adenoma in ascending colon, mild diverticulosis in sigmoid, internal hemorrhoids, needs repeat in 2015.  Performed by Dr. Carlean Purl  . Tinnitus    SINCE 1968  . Type 2 diabetes mellitus ([place])     Past Surgical History:  Procedure Laterality Date  . APPENDECTOMY  1959  . BILATERAL KNEE ARTHROSCOPY    . COLONOSCOPY  04/02/09   7 mm sessile polyp ascending colon/internal hemmorrhoids/mild diverticulosis sigmoid colon  . EYE SURGERY Bilateral    cataracts  . REVERSE SHOULDER ARTHROPLASTY Left 11/04/2016  . REVERSE SHOULDER ARTHROPLASTY Left 11/04/2016   Procedure: LEFT REVERSE SHOULDER ARTHROPLASTY;  Surgeon: Justice Britain, MD;  Location: [facility];  Service: Orthopedics;  Laterality: Left;  . right shoulder arthroscopy    . ROTATOR CUFF REPAIR  1990   Left shoulder  . TOTAL KNEE ARTHROPLASTY  2007   Left knee  . TOTAL KNEE ARTHROPLASTY Right 12/24/2014   Procedure: RIGHT TOTAL KNEE ARTHROPLASTY;  Surgeon: Sydnee Cabal, MD;  [facility];  Service: Orthopedics;  Laterality: Right;     Current Outpatient Medications  Medication Sig Dispense Refill  . aspirin 81 MG tablet Take 81 mg by mouth 2 (two) times daily.    . canagliflozin (INVOKANA) 300 MG TABS tablet Take 300 mg by mouth every evening.    [name] glipiZIDE (GLUCOTROL) 10 MG tablet Take 15 mg by mouth daily before breakfast.    . metFORMIN (GLUCOPHAGE) 1000 MG  tablet Take 1,000 mg by mouth 2 (two) times daily.  0  . simvastatin (ZOCOR) 40 MG tablet Take 40 mg by mouth at bedtime.     [name] telmisartan-hydrochlorothiazide (MICARDIS HCT) 40-12.5 MG per tablet Take 1 tablet by mouth every morning.      No current facility-administered medications for this visit.     Allergies:   Penicillins    Social History:  The patient  reports that he quit smoking about 27 years ago. His smoking use included cigarettes. He has a 5.00 pack-year smoking history. He has never used smokeless tobacco. He reports that he drinks about 1.2 oz of alcohol per week. He reports that he does not use drugs.   Family History:  The patient's family history includes  Diabetes type II in his mother; Heart failure in his mother; Lymphoma in his father.    ROS: All other systems are reviewed and negative. Unless otherwise mentioned in H&P    PHYSICAL EXAM: VS:  BP 137/73 (BP Location: Left Arm)   Pulse (!) 57   Ht 5\' 9"  (1.753 m)   Wt 205 lb (93 kg)   SpO2 97%   BMI 30.27 kg/m  , BMI Body mass index is 30.27 kg/m. GEN: Well nourished, well developed, in no acute distress  HEENT: normal  Neck: no JVD, carotid bruits, or masses Cardiac: RRR; no murmurs, rubs, or gallops,no edema  Respiratory:  Clear to auscultation bilaterally, normal work of breathing GI: soft, nontender, nondistended, + BS MS: no deformity or atrophy  Skin: warm and dry, no rash Neuro:  Strength and sensation are intact Psych: euthymic mood, full affect   EKG:  Mot completed during this office visit.   Recent Labs: No results found for requested labs within last 8760 hours.    Lipid Panel No results found for: CHOL, TRIG, HDL, CHOLHDL, VLDL, LDLCALC, LDLDIRECT    Wt Readings from Last 3 Encounters:  04/26/18 205 lb (93 kg)  04/04/18 206 lb (93.4 kg)  03/27/18 206 lb (93.4 kg)      Other studies Reviewed:  NM 04/04/2018 Study Highlights     The left ventricular ejection fraction is mildly decreased (45-54%).  Nuclear stress EF: 51%.  Blood pressure demonstrated a normal response to exercise.  There was no ST segment deviation noted during stress.  The study is normal.  This is a low risk study.   Normal resting and stress perfusion. No ischemia or infarction EF 51%    Echocardiogram 04/03/2018 Left ventricle: The cavity size was normal. Systolic function was   normal. The estimated ejection fraction was in the range of 60%   to 65%. Wall motion was normal; there were no regional wall   motion abnormalities. Left ventricular diastolic function   parameters were normal. - Left atrium: The atrium was mildly dilated. - Atrial septum: No defect or  patent foramen ovale was identified.  ASSESSMENT AND PLAN:  1.Fatigue:  He is feeling less fatigue now. He believes it was related to playing golf during high pollen season. He denies chest pain or dyspnea now. He walks 3-4 times a week along the entire course. He is feeling better now.   I have reviewed his stress test and is echo with him, both of which were normal. He is given reassurance.   2. Hypertension: BP is controlled. He is getting his medications via the [place] and Micardis has been changed to two separate Rx: telmisartan and HCTZ. I have explained that sometimes the  ARB;s can cause a tickle cough but he states he is coughing to clear sinus drainage. Will watch and wait. May consider changing ARB to CCB such as amlodipine.  3. Hypercholesterolemia: Continues on simvastatin. Labs per [place]. Sees them in a couple of months.    Current medicines are reviewed at length with the patient today.  He requests that this note be sent to Dr. Arletha Grippe at the [place] clinic. This is completed.   Labs/ tests ordered today include: None  Phill Myron. West Pugh, ANP, AACC   04/26/2018 8:22 AM    [facility] [address], [place], [place] [address] Phone: [phone]; Fax: [phone]

## 2018-04-26 ENCOUNTER — Ambulatory Visit (INDEPENDENT_AMBULATORY_CARE_PROVIDER_SITE_OTHER): Payer: PPO | Admitting: Adult Health

## 2018-04-26 ENCOUNTER — Encounter: Payer: Self-pay | Admitting: Adult Health

## 2018-04-26 VITALS — BP 137/73 | HR 57 | Ht 69.0 in | Wt 205.0 lb

## 2018-04-26 DIAGNOSIS — E7800 Pure hypercholesterolemia, unspecified: Secondary | ICD-10-CM | POA: Diagnosis not present

## 2018-04-26 DIAGNOSIS — R0602 Shortness of breath: Secondary | ICD-10-CM | POA: Diagnosis not present

## 2018-04-26 DIAGNOSIS — I10 Essential (primary) hypertension: Secondary | ICD-10-CM | POA: Diagnosis not present

## 2018-04-26 NOTE — Patient Instructions (Signed)
Medication Instructions:  NO CHANGES- Your physician recommends that you continue on your current medications as directed. Please refer to the Current Medication list given to you today.  If you need a refill on your cardiac medications before your next appointment, please call your pharmacy.  Follow-Up: Your physician wants you to follow-up in: 12 MONTHS You should receive a reminder letter in the mail two months in advance. If you do not receive a letter, please call our office MARCH 2020 to schedule the MAY 2020 follow-up appointment.   Thank you for choosing [facility] at [facility]!!

## 2018-06-07 DIAGNOSIS — M9901 Segmental and somatic dysfunction of cervical region: Secondary | ICD-10-CM | POA: Diagnosis not present

## 2018-06-07 DIAGNOSIS — M47812 Spondylosis without myelopathy or radiculopathy, cervical region: Secondary | ICD-10-CM | POA: Diagnosis not present

## 2018-06-07 DIAGNOSIS — M531 Cervicobrachial syndrome: Secondary | ICD-10-CM | POA: Diagnosis not present

## 2018-06-08 DIAGNOSIS — M9901 Segmental and somatic dysfunction of cervical region: Secondary | ICD-10-CM | POA: Diagnosis not present

## 2018-06-08 DIAGNOSIS — M531 Cervicobrachial syndrome: Secondary | ICD-10-CM | POA: Diagnosis not present

## 2018-06-08 DIAGNOSIS — M47812 Spondylosis without myelopathy or radiculopathy, cervical region: Secondary | ICD-10-CM | POA: Diagnosis not present

## 2018-06-12 DIAGNOSIS — M531 Cervicobrachial syndrome: Secondary | ICD-10-CM | POA: Diagnosis not present

## 2018-06-12 DIAGNOSIS — M9901 Segmental and somatic dysfunction of cervical region: Secondary | ICD-10-CM | POA: Diagnosis not present

## 2018-06-12 DIAGNOSIS — H16002 Unspecified corneal ulcer, left eye: Secondary | ICD-10-CM | POA: Diagnosis not present

## 2018-06-12 DIAGNOSIS — M47812 Spondylosis without myelopathy or radiculopathy, cervical region: Secondary | ICD-10-CM | POA: Diagnosis not present

## 2018-06-14 DIAGNOSIS — M9901 Segmental and somatic dysfunction of cervical region: Secondary | ICD-10-CM | POA: Diagnosis not present

## 2018-06-14 DIAGNOSIS — M47812 Spondylosis without myelopathy or radiculopathy, cervical region: Secondary | ICD-10-CM | POA: Diagnosis not present

## 2018-06-14 DIAGNOSIS — M531 Cervicobrachial syndrome: Secondary | ICD-10-CM | POA: Diagnosis not present

## 2018-06-27 DIAGNOSIS — L820 Inflamed seborrheic keratosis: Secondary | ICD-10-CM | POA: Diagnosis not present

## 2018-06-27 DIAGNOSIS — M47812 Spondylosis without myelopathy or radiculopathy, cervical region: Secondary | ICD-10-CM | POA: Diagnosis not present

## 2018-06-27 DIAGNOSIS — M9901 Segmental and somatic dysfunction of cervical region: Secondary | ICD-10-CM | POA: Diagnosis not present

## 2018-06-27 DIAGNOSIS — M531 Cervicobrachial syndrome: Secondary | ICD-10-CM | POA: Diagnosis not present

## 2018-07-11 DIAGNOSIS — Z6830 Body mass index (BMI) 30.0-30.9, adult: Secondary | ICD-10-CM | POA: Diagnosis not present

## 2018-07-11 DIAGNOSIS — Z0001 Encounter for general adult medical examination with abnormal findings: Secondary | ICD-10-CM | POA: Diagnosis not present

## 2018-07-11 DIAGNOSIS — Z1389 Encounter for screening for other disorder: Secondary | ICD-10-CM | POA: Diagnosis not present

## 2018-07-11 DIAGNOSIS — E6609 Other obesity due to excess calories: Secondary | ICD-10-CM | POA: Diagnosis not present

## 2018-07-11 DIAGNOSIS — E1165 Type 2 diabetes mellitus with hyperglycemia: Secondary | ICD-10-CM | POA: Diagnosis not present

## 2018-07-12 DIAGNOSIS — E119 Type 2 diabetes mellitus without complications: Secondary | ICD-10-CM | POA: Diagnosis not present

## 2018-08-04 DIAGNOSIS — M47812 Spondylosis without myelopathy or radiculopathy, cervical region: Secondary | ICD-10-CM | POA: Diagnosis not present

## 2018-08-04 DIAGNOSIS — M9901 Segmental and somatic dysfunction of cervical region: Secondary | ICD-10-CM | POA: Diagnosis not present

## 2018-08-04 DIAGNOSIS — M531 Cervicobrachial syndrome: Secondary | ICD-10-CM | POA: Diagnosis not present

## 2018-08-15 DIAGNOSIS — B078 Other viral warts: Secondary | ICD-10-CM | POA: Diagnosis not present

## 2018-08-15 DIAGNOSIS — X32XXXD Exposure to sunlight, subsequent encounter: Secondary | ICD-10-CM | POA: Diagnosis not present

## 2018-08-15 DIAGNOSIS — L570 Actinic keratosis: Secondary | ICD-10-CM | POA: Diagnosis not present

## 2018-08-17 DIAGNOSIS — M531 Cervicobrachial syndrome: Secondary | ICD-10-CM | POA: Diagnosis not present

## 2018-08-17 DIAGNOSIS — M9901 Segmental and somatic dysfunction of cervical region: Secondary | ICD-10-CM | POA: Diagnosis not present

## 2018-08-17 DIAGNOSIS — M47812 Spondylosis without myelopathy or radiculopathy, cervical region: Secondary | ICD-10-CM | POA: Diagnosis not present

## 2018-09-04 DIAGNOSIS — M25421 Effusion, right elbow: Secondary | ICD-10-CM | POA: Diagnosis not present

## 2018-09-04 DIAGNOSIS — Z23 Encounter for immunization: Secondary | ICD-10-CM | POA: Diagnosis not present

## 2018-09-04 DIAGNOSIS — M25521 Pain in right elbow: Secondary | ICD-10-CM | POA: Diagnosis not present

## 2018-09-04 DIAGNOSIS — E6609 Other obesity due to excess calories: Secondary | ICD-10-CM | POA: Diagnosis not present

## 2018-09-04 DIAGNOSIS — Z6830 Body mass index (BMI) 30.0-30.9, adult: Secondary | ICD-10-CM | POA: Diagnosis not present

## 2018-10-12 DIAGNOSIS — M531 Cervicobrachial syndrome: Secondary | ICD-10-CM | POA: Diagnosis not present

## 2018-10-12 DIAGNOSIS — M9901 Segmental and somatic dysfunction of cervical region: Secondary | ICD-10-CM | POA: Diagnosis not present

## 2018-10-12 DIAGNOSIS — M47812 Spondylosis without myelopathy or radiculopathy, cervical region: Secondary | ICD-10-CM | POA: Diagnosis not present

## 2018-10-13 DIAGNOSIS — M9901 Segmental and somatic dysfunction of cervical region: Secondary | ICD-10-CM | POA: Diagnosis not present

## 2018-10-13 DIAGNOSIS — M531 Cervicobrachial syndrome: Secondary | ICD-10-CM | POA: Diagnosis not present

## 2018-10-13 DIAGNOSIS — M47812 Spondylosis without myelopathy or radiculopathy, cervical region: Secondary | ICD-10-CM | POA: Diagnosis not present

## 2018-10-16 DIAGNOSIS — M9901 Segmental and somatic dysfunction of cervical region: Secondary | ICD-10-CM | POA: Diagnosis not present

## 2018-10-16 DIAGNOSIS — M531 Cervicobrachial syndrome: Secondary | ICD-10-CM | POA: Diagnosis not present

## 2018-10-16 DIAGNOSIS — M47812 Spondylosis without myelopathy or radiculopathy, cervical region: Secondary | ICD-10-CM | POA: Diagnosis not present

## 2018-11-14 DIAGNOSIS — M531 Cervicobrachial syndrome: Secondary | ICD-10-CM | POA: Diagnosis not present

## 2018-11-14 DIAGNOSIS — M47812 Spondylosis without myelopathy or radiculopathy, cervical region: Secondary | ICD-10-CM | POA: Diagnosis not present

## 2018-11-14 DIAGNOSIS — M9901 Segmental and somatic dysfunction of cervical region: Secondary | ICD-10-CM | POA: Diagnosis not present

## 2018-11-16 DIAGNOSIS — M531 Cervicobrachial syndrome: Secondary | ICD-10-CM | POA: Diagnosis not present

## 2018-11-16 DIAGNOSIS — M47812 Spondylosis without myelopathy or radiculopathy, cervical region: Secondary | ICD-10-CM | POA: Diagnosis not present

## 2018-11-16 DIAGNOSIS — M9901 Segmental and somatic dysfunction of cervical region: Secondary | ICD-10-CM | POA: Diagnosis not present

## 2018-11-20 DIAGNOSIS — M47812 Spondylosis without myelopathy or radiculopathy, cervical region: Secondary | ICD-10-CM | POA: Diagnosis not present

## 2018-11-20 DIAGNOSIS — M9901 Segmental and somatic dysfunction of cervical region: Secondary | ICD-10-CM | POA: Diagnosis not present

## 2018-11-20 DIAGNOSIS — M531 Cervicobrachial syndrome: Secondary | ICD-10-CM | POA: Diagnosis not present

## 2018-11-28 DIAGNOSIS — M9901 Segmental and somatic dysfunction of cervical region: Secondary | ICD-10-CM | POA: Diagnosis not present

## 2018-11-28 DIAGNOSIS — M47812 Spondylosis without myelopathy or radiculopathy, cervical region: Secondary | ICD-10-CM | POA: Diagnosis not present

## 2018-11-28 DIAGNOSIS — M531 Cervicobrachial syndrome: Secondary | ICD-10-CM | POA: Diagnosis not present

## 2018-12-12 DIAGNOSIS — M9901 Segmental and somatic dysfunction of cervical region: Secondary | ICD-10-CM | POA: Diagnosis not present

## 2018-12-12 DIAGNOSIS — M531 Cervicobrachial syndrome: Secondary | ICD-10-CM | POA: Diagnosis not present

## 2018-12-12 DIAGNOSIS — M47812 Spondylosis without myelopathy or radiculopathy, cervical region: Secondary | ICD-10-CM | POA: Diagnosis not present

## 2018-12-14 DIAGNOSIS — M531 Cervicobrachial syndrome: Secondary | ICD-10-CM | POA: Diagnosis not present

## 2018-12-14 DIAGNOSIS — M47812 Spondylosis without myelopathy or radiculopathy, cervical region: Secondary | ICD-10-CM | POA: Diagnosis not present

## 2018-12-14 DIAGNOSIS — M9901 Segmental and somatic dysfunction of cervical region: Secondary | ICD-10-CM | POA: Diagnosis not present

## 2018-12-18 DIAGNOSIS — M531 Cervicobrachial syndrome: Secondary | ICD-10-CM | POA: Diagnosis not present

## 2018-12-18 DIAGNOSIS — M47812 Spondylosis without myelopathy or radiculopathy, cervical region: Secondary | ICD-10-CM | POA: Diagnosis not present

## 2018-12-18 DIAGNOSIS — M9901 Segmental and somatic dysfunction of cervical region: Secondary | ICD-10-CM | POA: Diagnosis not present

## 2019-01-22 DIAGNOSIS — M9901 Segmental and somatic dysfunction of cervical region: Secondary | ICD-10-CM | POA: Diagnosis not present

## 2019-01-22 DIAGNOSIS — M47812 Spondylosis without myelopathy or radiculopathy, cervical region: Secondary | ICD-10-CM | POA: Diagnosis not present

## 2019-01-22 DIAGNOSIS — M531 Cervicobrachial syndrome: Secondary | ICD-10-CM | POA: Diagnosis not present

## 2019-01-26 DIAGNOSIS — M47812 Spondylosis without myelopathy or radiculopathy, cervical region: Secondary | ICD-10-CM | POA: Diagnosis not present

## 2019-01-26 DIAGNOSIS — M9901 Segmental and somatic dysfunction of cervical region: Secondary | ICD-10-CM | POA: Diagnosis not present

## 2019-01-26 DIAGNOSIS — M531 Cervicobrachial syndrome: Secondary | ICD-10-CM | POA: Diagnosis not present

## 2019-03-15 DIAGNOSIS — Z0000 Encounter for general adult medical examination without abnormal findings: Secondary | ICD-10-CM | POA: Diagnosis not present

## 2019-03-15 DIAGNOSIS — Z1389 Encounter for screening for other disorder: Secondary | ICD-10-CM | POA: Diagnosis not present

## 2019-04-25 DIAGNOSIS — M9901 Segmental and somatic dysfunction of cervical region: Secondary | ICD-10-CM | POA: Diagnosis not present

## 2019-04-25 DIAGNOSIS — M531 Cervicobrachial syndrome: Secondary | ICD-10-CM | POA: Diagnosis not present

## 2019-04-25 DIAGNOSIS — M47812 Spondylosis without myelopathy or radiculopathy, cervical region: Secondary | ICD-10-CM | POA: Diagnosis not present

## 2019-04-26 DIAGNOSIS — M47812 Spondylosis without myelopathy or radiculopathy, cervical region: Secondary | ICD-10-CM | POA: Diagnosis not present

## 2019-04-26 DIAGNOSIS — M531 Cervicobrachial syndrome: Secondary | ICD-10-CM | POA: Diagnosis not present

## 2019-04-26 DIAGNOSIS — M9901 Segmental and somatic dysfunction of cervical region: Secondary | ICD-10-CM | POA: Diagnosis not present

## 2019-04-30 DIAGNOSIS — M47812 Spondylosis without myelopathy or radiculopathy, cervical region: Secondary | ICD-10-CM | POA: Diagnosis not present

## 2019-04-30 DIAGNOSIS — M531 Cervicobrachial syndrome: Secondary | ICD-10-CM | POA: Diagnosis not present

## 2019-04-30 DIAGNOSIS — M9901 Segmental and somatic dysfunction of cervical region: Secondary | ICD-10-CM | POA: Diagnosis not present

## 2019-05-03 DIAGNOSIS — M531 Cervicobrachial syndrome: Secondary | ICD-10-CM | POA: Diagnosis not present

## 2019-05-03 DIAGNOSIS — M9901 Segmental and somatic dysfunction of cervical region: Secondary | ICD-10-CM | POA: Diagnosis not present

## 2019-05-03 DIAGNOSIS — M47812 Spondylosis without myelopathy or radiculopathy, cervical region: Secondary | ICD-10-CM | POA: Diagnosis not present

## 2019-06-07 DIAGNOSIS — E6609 Other obesity due to excess calories: Secondary | ICD-10-CM | POA: Diagnosis not present

## 2019-06-07 DIAGNOSIS — Z6830 Body mass index (BMI) 30.0-30.9, adult: Secondary | ICD-10-CM | POA: Diagnosis not present

## 2019-06-07 DIAGNOSIS — E119 Type 2 diabetes mellitus without complications: Secondary | ICD-10-CM | POA: Diagnosis not present

## 2019-06-07 DIAGNOSIS — Z1389 Encounter for screening for other disorder: Secondary | ICD-10-CM | POA: Diagnosis not present

## 2019-06-25 DIAGNOSIS — M47812 Spondylosis without myelopathy or radiculopathy, cervical region: Secondary | ICD-10-CM | POA: Diagnosis not present

## 2019-06-25 DIAGNOSIS — M9901 Segmental and somatic dysfunction of cervical region: Secondary | ICD-10-CM | POA: Diagnosis not present

## 2019-06-25 DIAGNOSIS — M531 Cervicobrachial syndrome: Secondary | ICD-10-CM | POA: Diagnosis not present

## 2019-06-26 DIAGNOSIS — M47812 Spondylosis without myelopathy or radiculopathy, cervical region: Secondary | ICD-10-CM | POA: Diagnosis not present

## 2019-06-26 DIAGNOSIS — M9901 Segmental and somatic dysfunction of cervical region: Secondary | ICD-10-CM | POA: Diagnosis not present

## 2019-06-26 DIAGNOSIS — M531 Cervicobrachial syndrome: Secondary | ICD-10-CM | POA: Diagnosis not present

## 2019-06-27 DIAGNOSIS — M9901 Segmental and somatic dysfunction of cervical region: Secondary | ICD-10-CM | POA: Diagnosis not present

## 2019-06-27 DIAGNOSIS — M531 Cervicobrachial syndrome: Secondary | ICD-10-CM | POA: Diagnosis not present

## 2019-06-27 DIAGNOSIS — M47812 Spondylosis without myelopathy or radiculopathy, cervical region: Secondary | ICD-10-CM | POA: Diagnosis not present

## 2019-06-28 DIAGNOSIS — M47812 Spondylosis without myelopathy or radiculopathy, cervical region: Secondary | ICD-10-CM | POA: Diagnosis not present

## 2019-06-28 DIAGNOSIS — M9901 Segmental and somatic dysfunction of cervical region: Secondary | ICD-10-CM | POA: Diagnosis not present

## 2019-06-28 DIAGNOSIS — M531 Cervicobrachial syndrome: Secondary | ICD-10-CM | POA: Diagnosis not present

## 2019-06-29 DIAGNOSIS — M9901 Segmental and somatic dysfunction of cervical region: Secondary | ICD-10-CM | POA: Diagnosis not present

## 2019-06-29 DIAGNOSIS — M531 Cervicobrachial syndrome: Secondary | ICD-10-CM | POA: Diagnosis not present

## 2019-06-29 DIAGNOSIS — M47812 Spondylosis without myelopathy or radiculopathy, cervical region: Secondary | ICD-10-CM | POA: Diagnosis not present

## 2019-07-10 DIAGNOSIS — X32XXXD Exposure to sunlight, subsequent encounter: Secondary | ICD-10-CM | POA: Diagnosis not present

## 2019-07-10 DIAGNOSIS — L570 Actinic keratosis: Secondary | ICD-10-CM | POA: Diagnosis not present

## 2019-07-12 DIAGNOSIS — M9903 Segmental and somatic dysfunction of lumbar region: Secondary | ICD-10-CM | POA: Diagnosis not present

## 2019-07-12 DIAGNOSIS — M47816 Spondylosis without myelopathy or radiculopathy, lumbar region: Secondary | ICD-10-CM | POA: Diagnosis not present

## 2019-07-12 DIAGNOSIS — M9901 Segmental and somatic dysfunction of cervical region: Secondary | ICD-10-CM | POA: Diagnosis not present

## 2019-07-12 DIAGNOSIS — M9902 Segmental and somatic dysfunction of thoracic region: Secondary | ICD-10-CM | POA: Diagnosis not present

## 2019-07-12 DIAGNOSIS — M47812 Spondylosis without myelopathy or radiculopathy, cervical region: Secondary | ICD-10-CM | POA: Diagnosis not present

## 2019-07-12 DIAGNOSIS — M546 Pain in thoracic spine: Secondary | ICD-10-CM | POA: Diagnosis not present

## 2019-08-27 DIAGNOSIS — M9902 Segmental and somatic dysfunction of thoracic region: Secondary | ICD-10-CM | POA: Diagnosis not present

## 2019-08-27 DIAGNOSIS — M9903 Segmental and somatic dysfunction of lumbar region: Secondary | ICD-10-CM | POA: Diagnosis not present

## 2019-08-27 DIAGNOSIS — M9901 Segmental and somatic dysfunction of cervical region: Secondary | ICD-10-CM | POA: Diagnosis not present

## 2019-08-27 DIAGNOSIS — S134XXA Sprain of ligaments of cervical spine, initial encounter: Secondary | ICD-10-CM | POA: Diagnosis not present

## 2019-08-27 DIAGNOSIS — S233XXA Sprain of ligaments of thoracic spine, initial encounter: Secondary | ICD-10-CM | POA: Diagnosis not present

## 2019-08-27 DIAGNOSIS — S338XXA Sprain of other parts of lumbar spine and pelvis, initial encounter: Secondary | ICD-10-CM | POA: Diagnosis not present

## 2019-08-28 DIAGNOSIS — M9902 Segmental and somatic dysfunction of thoracic region: Secondary | ICD-10-CM | POA: Diagnosis not present

## 2019-08-28 DIAGNOSIS — S338XXA Sprain of other parts of lumbar spine and pelvis, initial encounter: Secondary | ICD-10-CM | POA: Diagnosis not present

## 2019-08-28 DIAGNOSIS — S134XXA Sprain of ligaments of cervical spine, initial encounter: Secondary | ICD-10-CM | POA: Diagnosis not present

## 2019-08-28 DIAGNOSIS — M9903 Segmental and somatic dysfunction of lumbar region: Secondary | ICD-10-CM | POA: Diagnosis not present

## 2019-08-28 DIAGNOSIS — S233XXA Sprain of ligaments of thoracic spine, initial encounter: Secondary | ICD-10-CM | POA: Diagnosis not present

## 2019-08-28 DIAGNOSIS — M9901 Segmental and somatic dysfunction of cervical region: Secondary | ICD-10-CM | POA: Diagnosis not present

## 2019-08-30 DIAGNOSIS — S233XXA Sprain of ligaments of thoracic spine, initial encounter: Secondary | ICD-10-CM | POA: Diagnosis not present

## 2019-08-30 DIAGNOSIS — S134XXA Sprain of ligaments of cervical spine, initial encounter: Secondary | ICD-10-CM | POA: Diagnosis not present

## 2019-08-30 DIAGNOSIS — M9903 Segmental and somatic dysfunction of lumbar region: Secondary | ICD-10-CM | POA: Diagnosis not present

## 2019-08-30 DIAGNOSIS — S338XXA Sprain of other parts of lumbar spine and pelvis, initial encounter: Secondary | ICD-10-CM | POA: Diagnosis not present

## 2019-08-30 DIAGNOSIS — M9901 Segmental and somatic dysfunction of cervical region: Secondary | ICD-10-CM | POA: Diagnosis not present

## 2019-08-30 DIAGNOSIS — M9902 Segmental and somatic dysfunction of thoracic region: Secondary | ICD-10-CM | POA: Diagnosis not present

## 2019-09-19 DIAGNOSIS — M9902 Segmental and somatic dysfunction of thoracic region: Secondary | ICD-10-CM | POA: Diagnosis not present

## 2019-09-19 DIAGNOSIS — S338XXA Sprain of other parts of lumbar spine and pelvis, initial encounter: Secondary | ICD-10-CM | POA: Diagnosis not present

## 2019-09-19 DIAGNOSIS — M9903 Segmental and somatic dysfunction of lumbar region: Secondary | ICD-10-CM | POA: Diagnosis not present

## 2019-09-19 DIAGNOSIS — S134XXA Sprain of ligaments of cervical spine, initial encounter: Secondary | ICD-10-CM | POA: Diagnosis not present

## 2019-09-19 DIAGNOSIS — M9901 Segmental and somatic dysfunction of cervical region: Secondary | ICD-10-CM | POA: Diagnosis not present

## 2019-09-19 DIAGNOSIS — S233XXA Sprain of ligaments of thoracic spine, initial encounter: Secondary | ICD-10-CM | POA: Diagnosis not present

## 2019-09-24 DIAGNOSIS — S338XXA Sprain of other parts of lumbar spine and pelvis, initial encounter: Secondary | ICD-10-CM | POA: Diagnosis not present

## 2019-09-24 DIAGNOSIS — M9903 Segmental and somatic dysfunction of lumbar region: Secondary | ICD-10-CM | POA: Diagnosis not present

## 2019-09-24 DIAGNOSIS — S134XXA Sprain of ligaments of cervical spine, initial encounter: Secondary | ICD-10-CM | POA: Diagnosis not present

## 2019-09-24 DIAGNOSIS — M9902 Segmental and somatic dysfunction of thoracic region: Secondary | ICD-10-CM | POA: Diagnosis not present

## 2019-09-24 DIAGNOSIS — M9901 Segmental and somatic dysfunction of cervical region: Secondary | ICD-10-CM | POA: Diagnosis not present

## 2019-09-24 DIAGNOSIS — S233XXA Sprain of ligaments of thoracic spine, initial encounter: Secondary | ICD-10-CM | POA: Diagnosis not present

## 2019-10-22 DIAGNOSIS — E119 Type 2 diabetes mellitus without complications: Secondary | ICD-10-CM | POA: Diagnosis not present

## 2019-10-23 ENCOUNTER — Other Ambulatory Visit: Payer: Self-pay | Admitting: *Deleted

## 2019-10-23 NOTE — Patient Outreach (Signed)
[facility]) Care Management  10/23/2019  KARN LEDONNE [birth year] [id number]  Mr. Sparger was seen at [facility] office on 10/22/2019 for retinal screening.

## 2019-11-08 DIAGNOSIS — H524 Presbyopia: Secondary | ICD-10-CM | POA: Diagnosis not present

## 2019-11-21 DIAGNOSIS — X32XXXD Exposure to sunlight, subsequent encounter: Secondary | ICD-10-CM | POA: Diagnosis not present

## 2019-11-21 DIAGNOSIS — L570 Actinic keratosis: Secondary | ICD-10-CM | POA: Diagnosis not present

## 2019-11-21 DIAGNOSIS — B078 Other viral warts: Secondary | ICD-10-CM | POA: Diagnosis not present

## 2019-11-21 DIAGNOSIS — L821 Other seborrheic keratosis: Secondary | ICD-10-CM | POA: Diagnosis not present

## 2019-12-04 DIAGNOSIS — S233XXA Sprain of ligaments of thoracic spine, initial encounter: Secondary | ICD-10-CM | POA: Diagnosis not present

## 2019-12-04 DIAGNOSIS — M9903 Segmental and somatic dysfunction of lumbar region: Secondary | ICD-10-CM | POA: Diagnosis not present

## 2019-12-04 DIAGNOSIS — S338XXA Sprain of other parts of lumbar spine and pelvis, initial encounter: Secondary | ICD-10-CM | POA: Diagnosis not present

## 2019-12-04 DIAGNOSIS — S134XXA Sprain of ligaments of cervical spine, initial encounter: Secondary | ICD-10-CM | POA: Diagnosis not present

## 2019-12-04 DIAGNOSIS — M9901 Segmental and somatic dysfunction of cervical region: Secondary | ICD-10-CM | POA: Diagnosis not present

## 2019-12-04 DIAGNOSIS — M9902 Segmental and somatic dysfunction of thoracic region: Secondary | ICD-10-CM | POA: Diagnosis not present

## 2019-12-05 DIAGNOSIS — S134XXA Sprain of ligaments of cervical spine, initial encounter: Secondary | ICD-10-CM | POA: Diagnosis not present

## 2019-12-05 DIAGNOSIS — M9901 Segmental and somatic dysfunction of cervical region: Secondary | ICD-10-CM | POA: Diagnosis not present

## 2019-12-05 DIAGNOSIS — S233XXA Sprain of ligaments of thoracic spine, initial encounter: Secondary | ICD-10-CM | POA: Diagnosis not present

## 2019-12-05 DIAGNOSIS — M9903 Segmental and somatic dysfunction of lumbar region: Secondary | ICD-10-CM | POA: Diagnosis not present

## 2019-12-05 DIAGNOSIS — M9902 Segmental and somatic dysfunction of thoracic region: Secondary | ICD-10-CM | POA: Diagnosis not present

## 2019-12-05 DIAGNOSIS — S338XXA Sprain of other parts of lumbar spine and pelvis, initial encounter: Secondary | ICD-10-CM | POA: Diagnosis not present

## 2019-12-07 DIAGNOSIS — M9903 Segmental and somatic dysfunction of lumbar region: Secondary | ICD-10-CM | POA: Diagnosis not present

## 2019-12-07 DIAGNOSIS — S134XXA Sprain of ligaments of cervical spine, initial encounter: Secondary | ICD-10-CM | POA: Diagnosis not present

## 2019-12-07 DIAGNOSIS — M9902 Segmental and somatic dysfunction of thoracic region: Secondary | ICD-10-CM | POA: Diagnosis not present

## 2019-12-07 DIAGNOSIS — S338XXA Sprain of other parts of lumbar spine and pelvis, initial encounter: Secondary | ICD-10-CM | POA: Diagnosis not present

## 2019-12-07 DIAGNOSIS — M9901 Segmental and somatic dysfunction of cervical region: Secondary | ICD-10-CM | POA: Diagnosis not present

## 2019-12-07 DIAGNOSIS — S233XXA Sprain of ligaments of thoracic spine, initial encounter: Secondary | ICD-10-CM | POA: Diagnosis not present

## 2019-12-10 DIAGNOSIS — S134XXA Sprain of ligaments of cervical spine, initial encounter: Secondary | ICD-10-CM | POA: Diagnosis not present

## 2019-12-10 DIAGNOSIS — Z6830 Body mass index (BMI) 30.0-30.9, adult: Secondary | ICD-10-CM | POA: Diagnosis not present

## 2019-12-10 DIAGNOSIS — Z0001 Encounter for general adult medical examination with abnormal findings: Secondary | ICD-10-CM | POA: Diagnosis not present

## 2019-12-10 DIAGNOSIS — M9901 Segmental and somatic dysfunction of cervical region: Secondary | ICD-10-CM | POA: Diagnosis not present

## 2019-12-10 DIAGNOSIS — Z0000 Encounter for general adult medical examination without abnormal findings: Secondary | ICD-10-CM | POA: Diagnosis not present

## 2019-12-10 DIAGNOSIS — S233XXA Sprain of ligaments of thoracic spine, initial encounter: Secondary | ICD-10-CM | POA: Diagnosis not present

## 2019-12-10 DIAGNOSIS — S338XXA Sprain of other parts of lumbar spine and pelvis, initial encounter: Secondary | ICD-10-CM | POA: Diagnosis not present

## 2019-12-10 DIAGNOSIS — E119 Type 2 diabetes mellitus without complications: Secondary | ICD-10-CM | POA: Diagnosis not present

## 2019-12-10 DIAGNOSIS — M9903 Segmental and somatic dysfunction of lumbar region: Secondary | ICD-10-CM | POA: Diagnosis not present

## 2019-12-10 DIAGNOSIS — M9902 Segmental and somatic dysfunction of thoracic region: Secondary | ICD-10-CM | POA: Diagnosis not present

## 2019-12-10 DIAGNOSIS — E785 Hyperlipidemia, unspecified: Secondary | ICD-10-CM | POA: Diagnosis not present

## 2019-12-10 DIAGNOSIS — Z1389 Encounter for screening for other disorder: Secondary | ICD-10-CM | POA: Diagnosis not present

## 2019-12-12 DIAGNOSIS — S338XXA Sprain of other parts of lumbar spine and pelvis, initial encounter: Secondary | ICD-10-CM | POA: Diagnosis not present

## 2019-12-12 DIAGNOSIS — M9902 Segmental and somatic dysfunction of thoracic region: Secondary | ICD-10-CM | POA: Diagnosis not present

## 2019-12-12 DIAGNOSIS — M9903 Segmental and somatic dysfunction of lumbar region: Secondary | ICD-10-CM | POA: Diagnosis not present

## 2019-12-12 DIAGNOSIS — S233XXA Sprain of ligaments of thoracic spine, initial encounter: Secondary | ICD-10-CM | POA: Diagnosis not present

## 2019-12-12 DIAGNOSIS — M9901 Segmental and somatic dysfunction of cervical region: Secondary | ICD-10-CM | POA: Diagnosis not present

## 2019-12-12 DIAGNOSIS — S134XXA Sprain of ligaments of cervical spine, initial encounter: Secondary | ICD-10-CM | POA: Diagnosis not present

## 2019-12-14 DIAGNOSIS — S338XXA Sprain of other parts of lumbar spine and pelvis, initial encounter: Secondary | ICD-10-CM | POA: Diagnosis not present

## 2019-12-14 DIAGNOSIS — M9902 Segmental and somatic dysfunction of thoracic region: Secondary | ICD-10-CM | POA: Diagnosis not present

## 2019-12-14 DIAGNOSIS — S134XXA Sprain of ligaments of cervical spine, initial encounter: Secondary | ICD-10-CM | POA: Diagnosis not present

## 2019-12-14 DIAGNOSIS — M9901 Segmental and somatic dysfunction of cervical region: Secondary | ICD-10-CM | POA: Diagnosis not present

## 2019-12-14 DIAGNOSIS — M9903 Segmental and somatic dysfunction of lumbar region: Secondary | ICD-10-CM | POA: Diagnosis not present

## 2019-12-14 DIAGNOSIS — S233XXA Sprain of ligaments of thoracic spine, initial encounter: Secondary | ICD-10-CM | POA: Diagnosis not present

## 2019-12-18 DIAGNOSIS — S233XXA Sprain of ligaments of thoracic spine, initial encounter: Secondary | ICD-10-CM | POA: Diagnosis not present

## 2019-12-18 DIAGNOSIS — S134XXA Sprain of ligaments of cervical spine, initial encounter: Secondary | ICD-10-CM | POA: Diagnosis not present

## 2019-12-18 DIAGNOSIS — M9902 Segmental and somatic dysfunction of thoracic region: Secondary | ICD-10-CM | POA: Diagnosis not present

## 2019-12-18 DIAGNOSIS — M9901 Segmental and somatic dysfunction of cervical region: Secondary | ICD-10-CM | POA: Diagnosis not present

## 2019-12-18 DIAGNOSIS — M9903 Segmental and somatic dysfunction of lumbar region: Secondary | ICD-10-CM | POA: Diagnosis not present

## 2019-12-18 DIAGNOSIS — S338XXA Sprain of other parts of lumbar spine and pelvis, initial encounter: Secondary | ICD-10-CM | POA: Diagnosis not present

## 2020-01-08 DIAGNOSIS — L570 Actinic keratosis: Secondary | ICD-10-CM | POA: Diagnosis not present

## 2020-01-08 DIAGNOSIS — X32XXXD Exposure to sunlight, subsequent encounter: Secondary | ICD-10-CM | POA: Diagnosis not present

## 2020-02-27 DIAGNOSIS — E119 Type 2 diabetes mellitus without complications: Secondary | ICD-10-CM | POA: Diagnosis not present

## 2020-02-27 DIAGNOSIS — M1991 Primary osteoarthritis, unspecified site: Secondary | ICD-10-CM | POA: Diagnosis not present

## 2020-02-27 DIAGNOSIS — E7849 Other hyperlipidemia: Secondary | ICD-10-CM | POA: Diagnosis not present

## 2020-03-26 DIAGNOSIS — L820 Inflamed seborrheic keratosis: Secondary | ICD-10-CM | POA: Diagnosis not present

## 2020-03-26 DIAGNOSIS — L570 Actinic keratosis: Secondary | ICD-10-CM | POA: Diagnosis not present

## 2020-03-26 DIAGNOSIS — X32XXXD Exposure to sunlight, subsequent encounter: Secondary | ICD-10-CM | POA: Diagnosis not present

## 2020-04-28 DIAGNOSIS — E119 Type 2 diabetes mellitus without complications: Secondary | ICD-10-CM | POA: Diagnosis not present

## 2020-04-28 DIAGNOSIS — M1991 Primary osteoarthritis, unspecified site: Secondary | ICD-10-CM | POA: Diagnosis not present

## 2020-04-28 DIAGNOSIS — E7849 Other hyperlipidemia: Secondary | ICD-10-CM | POA: Diagnosis not present

## 2020-07-01 DIAGNOSIS — X32XXXD Exposure to sunlight, subsequent encounter: Secondary | ICD-10-CM | POA: Diagnosis not present

## 2020-07-01 DIAGNOSIS — D225 Melanocytic nevi of trunk: Secondary | ICD-10-CM | POA: Diagnosis not present

## 2020-07-01 DIAGNOSIS — L570 Actinic keratosis: Secondary | ICD-10-CM | POA: Diagnosis not present

## 2020-07-01 DIAGNOSIS — L820 Inflamed seborrheic keratosis: Secondary | ICD-10-CM | POA: Diagnosis not present

## 2020-07-16 DIAGNOSIS — I10 Essential (primary) hypertension: Secondary | ICD-10-CM | POA: Diagnosis not present

## 2020-07-16 DIAGNOSIS — E663 Overweight: Secondary | ICD-10-CM | POA: Diagnosis not present

## 2020-07-16 DIAGNOSIS — E119 Type 2 diabetes mellitus without complications: Secondary | ICD-10-CM | POA: Diagnosis not present

## 2020-07-16 DIAGNOSIS — Z6829 Body mass index (BMI) 29.0-29.9, adult: Secondary | ICD-10-CM | POA: Diagnosis not present

## 2020-07-16 DIAGNOSIS — Z0000 Encounter for general adult medical examination without abnormal findings: Secondary | ICD-10-CM | POA: Diagnosis not present

## 2020-08-28 DIAGNOSIS — M1991 Primary osteoarthritis, unspecified site: Secondary | ICD-10-CM | POA: Diagnosis not present

## 2020-08-28 DIAGNOSIS — E7849 Other hyperlipidemia: Secondary | ICD-10-CM | POA: Diagnosis not present

## 2020-08-28 DIAGNOSIS — E119 Type 2 diabetes mellitus without complications: Secondary | ICD-10-CM | POA: Diagnosis not present

## 2020-09-30 DIAGNOSIS — X32XXXD Exposure to sunlight, subsequent encounter: Secondary | ICD-10-CM | POA: Diagnosis not present

## 2020-09-30 DIAGNOSIS — L570 Actinic keratosis: Secondary | ICD-10-CM | POA: Diagnosis not present

## 2020-10-13 NOTE — Progress Notes (Signed)
Cardiology Office Note   Date:  10/16/2020   ID:  RANON COVEN, DOB 08/23/1946, MRN [id number]  PCP:  Redmond School, MD  Cardiologist:   Wardell Pokorski Martinique, MD   Chief Complaint  Patient presents with  . Coronary Artery Disease      History of Present Illness: Reginald Chambers is a 74 y.o. male who presents for follow up.Reginald Chambers He has a history of DM type 2, hypercholesterolemia, and HTN. He had prior cardiac evaluation in 2005 and 2012 with normal stress Myoview studies. Repeat Echo and Myoview studies in 2019 were normal.  On follow up today he is feeling well. Walks about 25 miles a week and plays golf. Denies chest pain or SOB. No palpitations.   Past Medical History:  Diagnosis Date  . Cancer (Reginald Chambers)    skin cancer- basal cell CA  . Essential hypertension, benign   . GERD (gastroesophageal reflux disease)    pt. denies  . Hard of hearing   . Heart murmur    at birth  . History of agent Orange exposure    [place]  . History of skin cancer   . Malaria   . Mixed hyperlipidemia   . Nocturia   . Osteoarthritis   . Personal history of colonic polyps - adenoma 04/02/2009  . S/P colonoscopy May 2010   56mm sessile polyp, adenoma in ascending colon, mild diverticulosis in sigmoid, internal hemorrhoids, needs repeat in 2015. Performed by Dr. Carlean Purl  . Tinnitus    SINCE 1968  . Type 2 diabetes mellitus (Reginald Chambers)     Past Surgical History:  Procedure Laterality Date  . APPENDECTOMY  1959  . BILATERAL KNEE ARTHROSCOPY    . COLONOSCOPY  04/02/09   7 mm sessile polyp ascending colon/internal hemmorrhoids/mild diverticulosis sigmoid colon  . EYE SURGERY Bilateral    cataracts  . REVERSE SHOULDER ARTHROPLASTY Left 11/04/2016  . REVERSE SHOULDER ARTHROPLASTY Left 11/04/2016   Procedure: LEFT REVERSE SHOULDER ARTHROPLASTY;  Surgeon: Justice Britain, MD;  Location: [facility];  Service: Orthopedics;  Laterality: Left;  . right shoulder arthroscopy    . ROTATOR CUFF REPAIR  1990   Left  shoulder  . TOTAL KNEE ARTHROPLASTY  2007   Left knee  . TOTAL KNEE ARTHROPLASTY Right 12/24/2014   Procedure: RIGHT TOTAL KNEE ARTHROPLASTY;  Surgeon: Sydnee Cabal, MD;  [facility];  Service: Orthopedics;  Laterality: Right;     Current Outpatient Medications  Medication Sig Dispense Refill  . aspirin 81 MG tablet Take 81 mg by mouth 2 (two) times daily.    . empagliflozin (JARDIANCE) 25 MG TABS tablet Take 12.5 mg by mouth daily.     . famotidine (PEPCID) 40 MG tablet Take 40 mg by mouth daily.    Reginald Chambers GLIPIZIDE PO Take 15 mg by mouth daily before breakfast.     . metFORMIN (GLUCOPHAGE) 1000 MG tablet Take 1,000 mg by mouth 2 (two) times daily.  0  . pantoprazole (PROTONIX) 40 MG tablet Take 40 mg by mouth 2 (two) times daily.    . simvastatin (ZOCOR) 40 MG tablet Take 40 mg by mouth at bedtime.     Reginald Chambers telmisartan-hydrochlorothiazide (MICARDIS HCT) 40-12.5 MG per tablet Take 1 tablet by mouth every morning.      No current facility-administered medications for this visit.    Allergies:   Penicillins    Social History:  The patient  reports that he quit smoking about 29 years ago. His smoking use included cigarettes.  He has a 5.00 pack-year smoking history. He has never used smokeless tobacco. He reports current alcohol use of about 2.0 standard drinks of alcohol per week. He reports that he does not use drugs.   Family History:  The patient's family history includes Diabetes type II in his mother; Heart failure in his mother; Lymphoma in his father.    ROS:  Please see the history of present illness.   Otherwise, review of systems are positive for none.   All other systems are reviewed and negative.    PHYSICAL EXAM: VS:  BP 132/88   Pulse (!) 55   Ht 5\' 9"  (1.753 m)   Wt 205 lb (93 kg)   BMI 30.27 kg/m  , BMI Body mass index is 30.27 kg/m. GEN: Well nourished, obese, in no acute distress  HEENT: normal  Neck: no JVD, carotid bruits, or masses Cardiac: RRR; no  murmurs, rubs, or gallops,no edema. Pedal pulses 2+ Respiratory:  clear to auscultation bilaterally, normal work of breathing GI: soft, nontender, nondistended, + BS MS: no deformity or atrophy. Old surgical scars on knees.  Skin: warm and dry, no rash, no edema.  Neuro:  Strength and sensation are intact Psych: euthymic mood, full affect   EKG:  EKG is ordered today. The ekg ordered today demonstrates Sinus brady with rate 55. Normal Ecg. I have personally reviewed and interpreted this study.    Recent Labs: No results found for requested labs within last 8760 hours.    Lipid Panel No results found for: CHOL, TRIG, HDL, CHOLHDL, VLDL, LDLCALC, LDLDIRECT   Dated 12/10/19: cholesterol 137, triglycerides 77, HDL 50, LDL 72. Creatinine 1.07. otherwise CMET and CBC normal.  Dated 07/16/20: A1c 7.4%   Wt Readings from Last 3 Encounters:  10/16/20 205 lb (93 kg)  04/26/18 205 lb (93 kg)  04/04/18 206 lb (93.4 kg)      Other studies Reviewed: Additional studies/ records that were reviewed today include: old stress Myoview studies from 2005 and 2012.Reginald Chambers Review of the above records demonstrates: Normal perfusion. EF 56%.   Myoview 04/04/18: Study Highlights    The left ventricular ejection fraction is mildly decreased (45-54%).  Nuclear stress EF: 51%.  Blood pressure demonstrated a normal response to exercise.  There was no ST segment deviation noted during stress.  The study is normal.  This is a low risk study.   Normal resting and stress perfusion. No ischemia or infarction EF 51%  Echo 04/03/18: Study Conclusions   - Left ventricle: The cavity size was normal. Systolic function was  normal. The estimated ejection fraction was in the range of 60%  to 65%. Wall motion was normal; there were no regional wall  motion abnormalities. Left ventricular diastolic function  parameters were normal.  - Left atrium: The atrium was mildly dilated.  - Atrial septum: No  defect or patent foramen ovale was identified.    ASSESSMENT AND PLAN:  1. HTN controlled.  2. DM type 2 on Jardiance, metformin, Glucotrol. A1c 7.4%. per primary care.  3. Hyperlipidemia. On statin therapy. Well controlled.  4. Obesity.    Current medicines are reviewed at length with the patient today.  The patient does not have concerns regarding medicines.  The following changes have been made:  no change  Labs/ tests ordered today include:  No orders of the defined types were placed in this encounter.    Disposition:   FU one year  Signed, Cheri Ayotte Martinique, MD  10/16/2020 8:46 AM  [facility] [address], [place], [place], [address] Phone [phone], Fax [phone]

## 2020-10-16 ENCOUNTER — Encounter: Payer: Self-pay | Admitting: Cardiology

## 2020-10-16 ENCOUNTER — Ambulatory Visit: Payer: PPO | Admitting: Cardiology

## 2020-10-16 ENCOUNTER — Other Ambulatory Visit: Payer: Self-pay

## 2020-10-16 VITALS — BP 120/60 | HR 55 | Ht 69.0 in | Wt 205.0 lb

## 2020-10-16 DIAGNOSIS — E7800 Pure hypercholesterolemia, unspecified: Secondary | ICD-10-CM

## 2020-10-16 DIAGNOSIS — I10 Essential (primary) hypertension: Secondary | ICD-10-CM

## 2020-10-28 DIAGNOSIS — E119 Type 2 diabetes mellitus without complications: Secondary | ICD-10-CM | POA: Diagnosis not present

## 2020-10-28 DIAGNOSIS — M1991 Primary osteoarthritis, unspecified site: Secondary | ICD-10-CM | POA: Diagnosis not present

## 2020-10-28 DIAGNOSIS — E7849 Other hyperlipidemia: Secondary | ICD-10-CM | POA: Diagnosis not present

## 2020-11-05 DIAGNOSIS — H524 Presbyopia: Secondary | ICD-10-CM | POA: Diagnosis not present

## 2021-07-06 DIAGNOSIS — H524 Presbyopia: Secondary | ICD-10-CM | POA: Diagnosis not present

## 2021-07-06 DIAGNOSIS — E11319 Type 2 diabetes mellitus with unspecified diabetic retinopathy without macular edema: Secondary | ICD-10-CM | POA: Diagnosis not present

## 2021-08-19 DIAGNOSIS — Z1283 Encounter for screening for malignant neoplasm of skin: Secondary | ICD-10-CM | POA: Diagnosis not present

## 2021-08-19 DIAGNOSIS — D225 Melanocytic nevi of trunk: Secondary | ICD-10-CM | POA: Diagnosis not present

## 2021-08-19 DIAGNOSIS — X32XXXD Exposure to sunlight, subsequent encounter: Secondary | ICD-10-CM | POA: Diagnosis not present

## 2021-08-19 DIAGNOSIS — L570 Actinic keratosis: Secondary | ICD-10-CM | POA: Diagnosis not present

## 2021-11-03 DIAGNOSIS — Z85828 Personal history of other malignant neoplasm of skin: Secondary | ICD-10-CM | POA: Diagnosis not present

## 2021-11-03 DIAGNOSIS — L570 Actinic keratosis: Secondary | ICD-10-CM | POA: Diagnosis not present

## 2021-11-03 DIAGNOSIS — X32XXXD Exposure to sunlight, subsequent encounter: Secondary | ICD-10-CM | POA: Diagnosis not present

## 2021-11-03 DIAGNOSIS — Z08 Encounter for follow-up examination after completed treatment for malignant neoplasm: Secondary | ICD-10-CM | POA: Diagnosis not present

## 2021-11-03 DIAGNOSIS — L820 Inflamed seborrheic keratosis: Secondary | ICD-10-CM | POA: Diagnosis not present

## 2021-11-03 DIAGNOSIS — Z1283 Encounter for screening for malignant neoplasm of skin: Secondary | ICD-10-CM | POA: Diagnosis not present

## 2021-12-04 ENCOUNTER — Ambulatory Visit: Payer: PPO | Admitting: Cardiology

## 2021-12-07 NOTE — Progress Notes (Signed)
Cardiology Office Note   Date:  12/18/2021   ID:  Reginald Chambers Jun 06, 1946, MRN [id number]  PCP:  Redmond School, MD  Cardiologist:   Kaeleen Odom Martinique, MD   Chief Complaint  Patient presents with   Hypertension   Hyperlipidemia      History of Present Illness: Reginald Chambers is a 76 y.o. male who presents for follow up.Reginald Chambers He has a history of DM type 2, hypercholesterolemia, and HTN. He had prior cardiac evaluation in 2005 and 2012 with normal stress Myoview studies. Repeat Echo and Myoview studies in 2019 were normal.  On follow up today he is feeling well. Walks about 25 miles a week and plays golf. Denies chest pain or SOB. No palpitations. He did have a case of bronchitis after Xmas but this is improved after antibiotics and steroids. Tolerating medication well.   Past Medical History:  Diagnosis Date   Cancer ([place])    skin cancer- basal cell CA   Essential hypertension, benign    GERD (gastroesophageal reflux disease)    pt. denies   Hard of hearing    Heart murmur    at birth   History of agent Orange exposure    [place]   History of skin cancer    [place]    Mixed hyperlipidemia    Nocturia    Osteoarthritis    Personal history of colonic polyps - adenoma 04/02/2009   S/P colonoscopy May 2010   57mm sessile polyp, adenoma in ascending colon, mild diverticulosis in sigmoid, internal hemorrhoids, needs repeat in 2015. Performed by Dr. Carlean Purl   Tinnitus    SINCE 1968   Type 2 diabetes mellitus [facility])     Past Surgical History:  Procedure Laterality Date   APPENDECTOMY  1959   BILATERAL KNEE ARTHROSCOPY     COLONOSCOPY  04/02/09   7 mm sessile polyp ascending colon/internal hemmorrhoids/mild diverticulosis sigmoid colon   EYE SURGERY Bilateral    cataracts   REVERSE SHOULDER ARTHROPLASTY Left 11/04/2016   REVERSE SHOULDER ARTHROPLASTY Left 11/04/2016   Procedure: LEFT REVERSE SHOULDER ARTHROPLASTY;  Surgeon: Justice Britain, MD;  Location: [facility];  Service:  Orthopedics;  Laterality: Left;   right shoulder arthroscopy     ROTATOR CUFF REPAIR  1990   Left shoulder   TOTAL KNEE ARTHROPLASTY  2007   Left knee   TOTAL KNEE ARTHROPLASTY Right 12/24/2014   Procedure: RIGHT TOTAL KNEE ARTHROPLASTY;  Surgeon: Sydnee Cabal, MD;  [facility];  Service: Orthopedics;  Laterality: Right;     Current Outpatient Medications  Medication Sig Dispense Refill   aspirin 81 MG tablet Take 81 mg by mouth 2 (two) times daily.     empagliflozin (JARDIANCE) 25 MG TABS tablet Take 12.5 mg by mouth daily.      famotidine (PEPCID) 40 MG tablet Take 40 mg by mouth daily.     gabapentin (NEURONTIN) 300 MG capsule TAKE ONE CAPSULE BY MOUTH AT BEDTIME FOR NEUROPATHIC PAIN     GLIPIZIDE PO Take 15 mg by mouth daily before breakfast.      hydrochlorothiazide (HYDRODIURIL) 25 MG tablet Take 0.5 tablets by mouth daily.     losartan (COZAAR) 100 MG tablet TAKE ONE-HALF TABLET BY MOUTH DAILY (TAKE IN PLACE OF TELMISARTAN)     metFORMIN (GLUCOPHAGE) 1000 MG tablet Take 1,000 mg by mouth 2 (two) times daily.  0   pantoprazole (PROTONIX) 40 MG tablet Take 40 mg by mouth 2 (two) times daily.  simvastatin (ZOCOR) 40 MG tablet Take 40 mg by mouth at bedtime.      No current facility-administered medications for this visit.    Allergies:   Penicillins    Social History:  The patient  reports that he quit smoking about 30 years ago. His smoking use included cigarettes. He has a 5.00 pack-year smoking history. He has never used smokeless tobacco. He reports current alcohol use of about 2.0 standard drinks per week. He reports that he does not use drugs.   Family History:  The patient's family history includes Diabetes type II in his mother; Heart failure in his mother; Lymphoma in his father.    ROS:  Please see the history of present illness.   Otherwise, review of systems are positive for none.   All other systems are reviewed and negative.    PHYSICAL EXAM: VS:  BP  136/78    Pulse (!) 51    Ht 5\' 9"  (1.753 m)    Wt 196 lb (88.9 kg)    SpO2 98%    BMI 28.94 kg/m  , BMI Body mass index is 28.94 kg/m. GEN: Well nourished, obese, in no acute distress  HEENT: normal  Neck: no JVD, carotid bruits, or masses Cardiac: RRR; no murmurs, rubs, or gallops,no edema. Pedal pulses 2+ Respiratory:  clear to auscultation bilaterally, normal work of breathing GI: soft, nontender, nondistended, + BS MS: no deformity or atrophy. Old surgical scars on knees.  Skin: warm and dry, no rash, no edema.  Neuro:  Strength and sensation are intact Psych: euthymic mood, full affect   EKG:  EKG is ordered today. The ekg ordered today demonstrates Sinus brady with rate 51. Normal Ecg. I have personally reviewed and interpreted this study.    Recent Labs: No results found for requested labs within last 8760 hours.    Lipid Panel No results found for: CHOL, TRIG, HDL, CHOLHDL, VLDL, LDLCALC, LDLDIRECT   Dated 12/10/19: cholesterol 137, triglycerides 77, HDL 50, LDL 72. Creatinine 1.07. otherwise CMET and CBC normal.  Dated 07/16/20: A1c 7.4% Dated 01/01/21: cholesterol 125, triglycerides 89, HDL 44, LDL 64. A1c 7.7%.CMET and CBC normal   Wt Readings from Last 3 Encounters:  12/18/21 196 lb (88.9 kg)  10/16/20 205 lb (93 kg)  04/26/18 205 lb (93 kg)      Other studies Reviewed: Additional studies/ records that were reviewed today include: old stress Myoview studies from 2005 and 2012.Reginald Chambers Review of the above records demonstrates: Normal perfusion. EF 56%.   Myoview 04/04/18: Study Highlights    The left ventricular ejection fraction is mildly decreased (45-54%). Nuclear stress EF: 51%. Blood pressure demonstrated a normal response to exercise. There was no ST segment deviation noted during stress. The study is normal. This is a low risk study.   Normal resting and stress perfusion. No ischemia or infarction EF 51%   Echo 04/03/18: Study Conclusions   - Left  ventricle: The cavity size was normal. Systolic function was    normal. The estimated ejection fraction was in the range of 60%    to 65%. Wall motion was normal; there were no regional wall    motion abnormalities. Left ventricular diastolic function    parameters were normal.  - Left atrium: The atrium was mildly dilated.  - Atrial septum: No defect or patent foramen ovale was identified.    ASSESSMENT AND PLAN:  1. HTN controlled.  2. DM type 2 on Jardiance, metformin, Glipizide.  per primary care.  3. Hyperlipidemia. On statin therapy. Well controlled.    Current medicines are reviewed at length with the patient today.  The patient does not have concerns regarding medicines.  The following changes have been made:  no change  Labs/ tests ordered today include:  No orders of the defined types were placed in this encounter.    Disposition:   FU one year  Signed, Shelma Eiben Martinique, MD  12/18/2021 8:27 AM    [facility] [address] [place], [place], [address] Phone [phone], Fax [phone]

## 2021-12-14 DIAGNOSIS — J209 Acute bronchitis, unspecified: Secondary | ICD-10-CM | POA: Diagnosis not present

## 2021-12-14 DIAGNOSIS — J22 Unspecified acute lower respiratory infection: Secondary | ICD-10-CM | POA: Diagnosis not present

## 2021-12-18 ENCOUNTER — Ambulatory Visit: Payer: Medicare Other | Admitting: Cardiology

## 2021-12-18 ENCOUNTER — Other Ambulatory Visit: Payer: Self-pay

## 2021-12-18 ENCOUNTER — Encounter: Payer: Self-pay | Admitting: Cardiology

## 2021-12-18 VITALS — BP 136/78 | HR 51 | Ht 69.0 in | Wt 196.0 lb

## 2021-12-18 DIAGNOSIS — E7800 Pure hypercholesterolemia, unspecified: Secondary | ICD-10-CM

## 2021-12-18 DIAGNOSIS — I10 Essential (primary) hypertension: Secondary | ICD-10-CM

## 2022-02-02 DIAGNOSIS — M47812 Spondylosis without myelopathy or radiculopathy, cervical region: Secondary | ICD-10-CM | POA: Diagnosis not present

## 2022-02-02 DIAGNOSIS — M531 Cervicobrachial syndrome: Secondary | ICD-10-CM | POA: Diagnosis not present

## 2022-02-02 DIAGNOSIS — M9901 Segmental and somatic dysfunction of cervical region: Secondary | ICD-10-CM | POA: Diagnosis not present

## 2022-02-05 DIAGNOSIS — M9901 Segmental and somatic dysfunction of cervical region: Secondary | ICD-10-CM | POA: Diagnosis not present

## 2022-02-05 DIAGNOSIS — M47812 Spondylosis without myelopathy or radiculopathy, cervical region: Secondary | ICD-10-CM | POA: Diagnosis not present

## 2022-02-05 DIAGNOSIS — M531 Cervicobrachial syndrome: Secondary | ICD-10-CM | POA: Diagnosis not present

## 2022-05-04 DIAGNOSIS — L570 Actinic keratosis: Secondary | ICD-10-CM | POA: Diagnosis not present

## 2022-05-04 DIAGNOSIS — X32XXXD Exposure to sunlight, subsequent encounter: Secondary | ICD-10-CM | POA: Diagnosis not present

## 2022-07-15 DIAGNOSIS — S81812A Laceration without foreign body, left lower leg, initial encounter: Secondary | ICD-10-CM | POA: Diagnosis not present

## 2022-07-15 DIAGNOSIS — S80812A Abrasion, left lower leg, initial encounter: Secondary | ICD-10-CM | POA: Diagnosis not present

## 2022-07-15 DIAGNOSIS — E1140 Type 2 diabetes mellitus with diabetic neuropathy, unspecified: Secondary | ICD-10-CM | POA: Diagnosis not present

## 2022-07-15 DIAGNOSIS — L089 Local infection of the skin and subcutaneous tissue, unspecified: Secondary | ICD-10-CM | POA: Diagnosis not present

## 2022-07-22 DIAGNOSIS — B351 Tinea unguium: Secondary | ICD-10-CM | POA: Diagnosis not present

## 2022-07-22 DIAGNOSIS — L84 Corns and callosities: Secondary | ICD-10-CM | POA: Diagnosis not present

## 2022-07-22 DIAGNOSIS — M79676 Pain in unspecified toe(s): Secondary | ICD-10-CM | POA: Diagnosis not present

## 2022-07-22 DIAGNOSIS — E1142 Type 2 diabetes mellitus with diabetic polyneuropathy: Secondary | ICD-10-CM | POA: Diagnosis not present

## 2022-08-16 DIAGNOSIS — E039 Hypothyroidism, unspecified: Secondary | ICD-10-CM | POA: Diagnosis not present

## 2022-08-16 DIAGNOSIS — Z0001 Encounter for general adult medical examination with abnormal findings: Secondary | ICD-10-CM | POA: Diagnosis not present

## 2022-08-16 DIAGNOSIS — E559 Vitamin D deficiency, unspecified: Secondary | ICD-10-CM | POA: Diagnosis not present

## 2022-08-16 DIAGNOSIS — D518 Other vitamin B12 deficiency anemias: Secondary | ICD-10-CM | POA: Diagnosis not present

## 2022-08-16 DIAGNOSIS — E1140 Type 2 diabetes mellitus with diabetic neuropathy, unspecified: Secondary | ICD-10-CM | POA: Diagnosis not present

## 2022-09-03 DIAGNOSIS — M9902 Segmental and somatic dysfunction of thoracic region: Secondary | ICD-10-CM | POA: Diagnosis not present

## 2022-09-03 DIAGNOSIS — M47812 Spondylosis without myelopathy or radiculopathy, cervical region: Secondary | ICD-10-CM | POA: Diagnosis not present

## 2022-09-03 DIAGNOSIS — M47816 Spondylosis without myelopathy or radiculopathy, lumbar region: Secondary | ICD-10-CM | POA: Diagnosis not present

## 2022-09-03 DIAGNOSIS — M531 Cervicobrachial syndrome: Secondary | ICD-10-CM | POA: Diagnosis not present

## 2022-09-03 DIAGNOSIS — S233XXA Sprain of ligaments of thoracic spine, initial encounter: Secondary | ICD-10-CM | POA: Diagnosis not present

## 2022-09-03 DIAGNOSIS — M9901 Segmental and somatic dysfunction of cervical region: Secondary | ICD-10-CM | POA: Diagnosis not present

## 2022-09-03 DIAGNOSIS — M9903 Segmental and somatic dysfunction of lumbar region: Secondary | ICD-10-CM | POA: Diagnosis not present

## 2022-09-06 DIAGNOSIS — M531 Cervicobrachial syndrome: Secondary | ICD-10-CM | POA: Diagnosis not present

## 2022-09-06 DIAGNOSIS — M9902 Segmental and somatic dysfunction of thoracic region: Secondary | ICD-10-CM | POA: Diagnosis not present

## 2022-09-06 DIAGNOSIS — M9901 Segmental and somatic dysfunction of cervical region: Secondary | ICD-10-CM | POA: Diagnosis not present

## 2022-09-06 DIAGNOSIS — M9903 Segmental and somatic dysfunction of lumbar region: Secondary | ICD-10-CM | POA: Diagnosis not present

## 2022-09-06 DIAGNOSIS — M47812 Spondylosis without myelopathy or radiculopathy, cervical region: Secondary | ICD-10-CM | POA: Diagnosis not present

## 2022-09-08 DIAGNOSIS — M531 Cervicobrachial syndrome: Secondary | ICD-10-CM | POA: Diagnosis not present

## 2022-09-08 DIAGNOSIS — M9903 Segmental and somatic dysfunction of lumbar region: Secondary | ICD-10-CM | POA: Diagnosis not present

## 2022-09-08 DIAGNOSIS — S233XXA Sprain of ligaments of thoracic spine, initial encounter: Secondary | ICD-10-CM | POA: Diagnosis not present

## 2022-09-08 DIAGNOSIS — M9901 Segmental and somatic dysfunction of cervical region: Secondary | ICD-10-CM | POA: Diagnosis not present

## 2022-09-08 DIAGNOSIS — M47816 Spondylosis without myelopathy or radiculopathy, lumbar region: Secondary | ICD-10-CM | POA: Diagnosis not present

## 2022-09-08 DIAGNOSIS — M47812 Spondylosis without myelopathy or radiculopathy, cervical region: Secondary | ICD-10-CM | POA: Diagnosis not present

## 2022-09-08 DIAGNOSIS — M9902 Segmental and somatic dysfunction of thoracic region: Secondary | ICD-10-CM | POA: Diagnosis not present

## 2022-09-10 DIAGNOSIS — M531 Cervicobrachial syndrome: Secondary | ICD-10-CM | POA: Diagnosis not present

## 2022-09-10 DIAGNOSIS — S233XXA Sprain of ligaments of thoracic spine, initial encounter: Secondary | ICD-10-CM | POA: Diagnosis not present

## 2022-09-10 DIAGNOSIS — M9901 Segmental and somatic dysfunction of cervical region: Secondary | ICD-10-CM | POA: Diagnosis not present

## 2022-09-10 DIAGNOSIS — M9902 Segmental and somatic dysfunction of thoracic region: Secondary | ICD-10-CM | POA: Diagnosis not present

## 2022-09-10 DIAGNOSIS — M47816 Spondylosis without myelopathy or radiculopathy, lumbar region: Secondary | ICD-10-CM | POA: Diagnosis not present

## 2022-09-10 DIAGNOSIS — M9903 Segmental and somatic dysfunction of lumbar region: Secondary | ICD-10-CM | POA: Diagnosis not present

## 2022-09-10 DIAGNOSIS — M47812 Spondylosis without myelopathy or radiculopathy, cervical region: Secondary | ICD-10-CM | POA: Diagnosis not present

## 2022-09-14 DIAGNOSIS — M47812 Spondylosis without myelopathy or radiculopathy, cervical region: Secondary | ICD-10-CM | POA: Diagnosis not present

## 2022-09-14 DIAGNOSIS — M9901 Segmental and somatic dysfunction of cervical region: Secondary | ICD-10-CM | POA: Diagnosis not present

## 2022-09-14 DIAGNOSIS — M531 Cervicobrachial syndrome: Secondary | ICD-10-CM | POA: Diagnosis not present

## 2022-09-14 DIAGNOSIS — M9902 Segmental and somatic dysfunction of thoracic region: Secondary | ICD-10-CM | POA: Diagnosis not present

## 2022-09-14 DIAGNOSIS — M9903 Segmental and somatic dysfunction of lumbar region: Secondary | ICD-10-CM | POA: Diagnosis not present

## 2022-09-22 DIAGNOSIS — S233XXA Sprain of ligaments of thoracic spine, initial encounter: Secondary | ICD-10-CM | POA: Diagnosis not present

## 2022-09-22 DIAGNOSIS — M9903 Segmental and somatic dysfunction of lumbar region: Secondary | ICD-10-CM | POA: Diagnosis not present

## 2022-09-22 DIAGNOSIS — M9901 Segmental and somatic dysfunction of cervical region: Secondary | ICD-10-CM | POA: Diagnosis not present

## 2022-09-22 DIAGNOSIS — M531 Cervicobrachial syndrome: Secondary | ICD-10-CM | POA: Diagnosis not present

## 2022-09-22 DIAGNOSIS — M9902 Segmental and somatic dysfunction of thoracic region: Secondary | ICD-10-CM | POA: Diagnosis not present

## 2022-09-22 DIAGNOSIS — M47816 Spondylosis without myelopathy or radiculopathy, lumbar region: Secondary | ICD-10-CM | POA: Diagnosis not present

## 2022-09-22 DIAGNOSIS — M47812 Spondylosis without myelopathy or radiculopathy, cervical region: Secondary | ICD-10-CM | POA: Diagnosis not present

## 2022-09-27 DIAGNOSIS — M9903 Segmental and somatic dysfunction of lumbar region: Secondary | ICD-10-CM | POA: Diagnosis not present

## 2022-09-27 DIAGNOSIS — S233XXA Sprain of ligaments of thoracic spine, initial encounter: Secondary | ICD-10-CM | POA: Diagnosis not present

## 2022-09-27 DIAGNOSIS — M531 Cervicobrachial syndrome: Secondary | ICD-10-CM | POA: Diagnosis not present

## 2022-09-27 DIAGNOSIS — M47812 Spondylosis without myelopathy or radiculopathy, cervical region: Secondary | ICD-10-CM | POA: Diagnosis not present

## 2022-09-27 DIAGNOSIS — M47816 Spondylosis without myelopathy or radiculopathy, lumbar region: Secondary | ICD-10-CM | POA: Diagnosis not present

## 2022-09-27 DIAGNOSIS — M9902 Segmental and somatic dysfunction of thoracic region: Secondary | ICD-10-CM | POA: Diagnosis not present

## 2022-09-27 DIAGNOSIS — M9901 Segmental and somatic dysfunction of cervical region: Secondary | ICD-10-CM | POA: Diagnosis not present

## 2022-09-30 DIAGNOSIS — M9901 Segmental and somatic dysfunction of cervical region: Secondary | ICD-10-CM | POA: Diagnosis not present

## 2022-09-30 DIAGNOSIS — M47812 Spondylosis without myelopathy or radiculopathy, cervical region: Secondary | ICD-10-CM | POA: Diagnosis not present

## 2022-09-30 DIAGNOSIS — S233XXA Sprain of ligaments of thoracic spine, initial encounter: Secondary | ICD-10-CM | POA: Diagnosis not present

## 2022-09-30 DIAGNOSIS — M9903 Segmental and somatic dysfunction of lumbar region: Secondary | ICD-10-CM | POA: Diagnosis not present

## 2022-09-30 DIAGNOSIS — M47816 Spondylosis without myelopathy or radiculopathy, lumbar region: Secondary | ICD-10-CM | POA: Diagnosis not present

## 2022-09-30 DIAGNOSIS — M531 Cervicobrachial syndrome: Secondary | ICD-10-CM | POA: Diagnosis not present

## 2022-09-30 DIAGNOSIS — M9902 Segmental and somatic dysfunction of thoracic region: Secondary | ICD-10-CM | POA: Diagnosis not present

## 2022-10-04 DIAGNOSIS — M9901 Segmental and somatic dysfunction of cervical region: Secondary | ICD-10-CM | POA: Diagnosis not present

## 2022-10-04 DIAGNOSIS — M9902 Segmental and somatic dysfunction of thoracic region: Secondary | ICD-10-CM | POA: Diagnosis not present

## 2022-10-04 DIAGNOSIS — M9903 Segmental and somatic dysfunction of lumbar region: Secondary | ICD-10-CM | POA: Diagnosis not present

## 2022-10-04 DIAGNOSIS — M47812 Spondylosis without myelopathy or radiculopathy, cervical region: Secondary | ICD-10-CM | POA: Diagnosis not present

## 2022-10-04 DIAGNOSIS — M531 Cervicobrachial syndrome: Secondary | ICD-10-CM | POA: Diagnosis not present

## 2022-10-08 DIAGNOSIS — M47816 Spondylosis without myelopathy or radiculopathy, lumbar region: Secondary | ICD-10-CM | POA: Diagnosis not present

## 2022-10-08 DIAGNOSIS — M9901 Segmental and somatic dysfunction of cervical region: Secondary | ICD-10-CM | POA: Diagnosis not present

## 2022-10-08 DIAGNOSIS — M9903 Segmental and somatic dysfunction of lumbar region: Secondary | ICD-10-CM | POA: Diagnosis not present

## 2022-10-08 DIAGNOSIS — M531 Cervicobrachial syndrome: Secondary | ICD-10-CM | POA: Diagnosis not present

## 2022-10-08 DIAGNOSIS — S233XXA Sprain of ligaments of thoracic spine, initial encounter: Secondary | ICD-10-CM | POA: Diagnosis not present

## 2022-10-08 DIAGNOSIS — M9902 Segmental and somatic dysfunction of thoracic region: Secondary | ICD-10-CM | POA: Diagnosis not present

## 2022-10-08 DIAGNOSIS — M47812 Spondylosis without myelopathy or radiculopathy, cervical region: Secondary | ICD-10-CM | POA: Diagnosis not present

## 2022-10-13 DIAGNOSIS — M9902 Segmental and somatic dysfunction of thoracic region: Secondary | ICD-10-CM | POA: Diagnosis not present

## 2022-10-13 DIAGNOSIS — M9903 Segmental and somatic dysfunction of lumbar region: Secondary | ICD-10-CM | POA: Diagnosis not present

## 2022-10-13 DIAGNOSIS — M531 Cervicobrachial syndrome: Secondary | ICD-10-CM | POA: Diagnosis not present

## 2022-10-13 DIAGNOSIS — M47812 Spondylosis without myelopathy or radiculopathy, cervical region: Secondary | ICD-10-CM | POA: Diagnosis not present

## 2022-10-13 DIAGNOSIS — M9901 Segmental and somatic dysfunction of cervical region: Secondary | ICD-10-CM | POA: Diagnosis not present

## 2022-10-18 DIAGNOSIS — E559 Vitamin D deficiency, unspecified: Secondary | ICD-10-CM | POA: Diagnosis not present

## 2022-10-18 DIAGNOSIS — I10 Essential (primary) hypertension: Secondary | ICD-10-CM | POA: Diagnosis not present

## 2022-10-18 DIAGNOSIS — E039 Hypothyroidism, unspecified: Secondary | ICD-10-CM | POA: Diagnosis not present

## 2022-10-18 DIAGNOSIS — E119 Type 2 diabetes mellitus without complications: Secondary | ICD-10-CM | POA: Diagnosis not present

## 2022-10-18 DIAGNOSIS — E785 Hyperlipidemia, unspecified: Secondary | ICD-10-CM | POA: Diagnosis not present

## 2022-11-02 DIAGNOSIS — C44722 Squamous cell carcinoma of skin of right lower limb, including hip: Secondary | ICD-10-CM | POA: Diagnosis not present

## 2022-11-02 DIAGNOSIS — Z1283 Encounter for screening for malignant neoplasm of skin: Secondary | ICD-10-CM | POA: Diagnosis not present

## 2022-11-02 DIAGNOSIS — X32XXXD Exposure to sunlight, subsequent encounter: Secondary | ICD-10-CM | POA: Diagnosis not present

## 2022-11-02 DIAGNOSIS — L570 Actinic keratosis: Secondary | ICD-10-CM | POA: Diagnosis not present

## 2022-11-02 DIAGNOSIS — D225 Melanocytic nevi of trunk: Secondary | ICD-10-CM | POA: Diagnosis not present

## 2022-11-03 DIAGNOSIS — M9903 Segmental and somatic dysfunction of lumbar region: Secondary | ICD-10-CM | POA: Diagnosis not present

## 2022-11-03 DIAGNOSIS — M47812 Spondylosis without myelopathy or radiculopathy, cervical region: Secondary | ICD-10-CM | POA: Diagnosis not present

## 2022-11-03 DIAGNOSIS — M9902 Segmental and somatic dysfunction of thoracic region: Secondary | ICD-10-CM | POA: Diagnosis not present

## 2022-11-03 DIAGNOSIS — M47816 Spondylosis without myelopathy or radiculopathy, lumbar region: Secondary | ICD-10-CM | POA: Diagnosis not present

## 2022-11-03 DIAGNOSIS — M531 Cervicobrachial syndrome: Secondary | ICD-10-CM | POA: Diagnosis not present

## 2022-11-03 DIAGNOSIS — M9901 Segmental and somatic dysfunction of cervical region: Secondary | ICD-10-CM | POA: Diagnosis not present

## 2022-11-03 DIAGNOSIS — S233XXA Sprain of ligaments of thoracic spine, initial encounter: Secondary | ICD-10-CM | POA: Diagnosis not present

## 2022-11-11 DIAGNOSIS — L928 Other granulomatous disorders of the skin and subcutaneous tissue: Secondary | ICD-10-CM | POA: Diagnosis not present

## 2022-11-17 DIAGNOSIS — H43392 Other vitreous opacities, left eye: Secondary | ICD-10-CM | POA: Diagnosis not present

## 2023-04-05 ENCOUNTER — Encounter: Payer: Self-pay | Admitting: Nurse Practitioner

## 2023-04-05 ENCOUNTER — Ambulatory Visit: Payer: Medicare Other | Attending: Nurse Practitioner | Admitting: Nurse Practitioner

## 2023-04-05 VITALS — BP 130/70 | HR 56 | Ht 69.0 in | Wt 188.6 lb

## 2023-04-05 DIAGNOSIS — E782 Mixed hyperlipidemia: Secondary | ICD-10-CM

## 2023-04-05 DIAGNOSIS — Z7985 Long-term (current) use of injectable non-insulin antidiabetic drugs: Secondary | ICD-10-CM | POA: Diagnosis not present

## 2023-04-05 DIAGNOSIS — R5383 Other fatigue: Secondary | ICD-10-CM | POA: Diagnosis not present

## 2023-04-05 DIAGNOSIS — E119 Type 2 diabetes mellitus without complications: Secondary | ICD-10-CM

## 2023-04-05 DIAGNOSIS — R072 Precordial pain: Secondary | ICD-10-CM

## 2023-04-05 DIAGNOSIS — I10 Essential (primary) hypertension: Secondary | ICD-10-CM

## 2023-04-05 NOTE — Patient Instructions (Signed)
Medication Instructions:  Your physician recommends that you continue on your current medications as directed. Please refer to the Current Medication list given to you today.  *If you need a refill on your cardiac medications before your next appointment, please call your pharmacy*   Lab Work: BMET 1 week prior to procedure.   If you have labs (blood work) drawn today and your tests are completely normal, you will receive your results only by: MyChart Message (if you have MyChart) OR A paper copy in the mail If you have any lab test that is abnormal or we need to change your treatment, we will call you to review the results.   Testing/Procedures:   Your cardiac CT will be scheduled at one of the below locations:   [facility] [address] [place], [place] [address] [phone]  OR  [facility] [address] [place], [place] [address] [phone]  OR   [facility] [address] [place], [place] [address] [phone]  If scheduled at [facility], please arrive at the [facility] and Children's Entrance (Entrance C2) of [facility] 30 minutes prior to test start time. You can use the FREE valet parking offered at entrance C (encouraged to control the heart rate for the test)  Proceed to the [facility] Radiology Department (first floor) to check-in and test prep.  All radiology patients and guests should use entrance C2 at [facility], accessed from [facility], even though the hospital's physical address listed is [address].    If scheduled at [facility] or [facility], please arrive 15 mins early for check-in and test prep.   Please follow these instructions carefully (unless otherwise directed):  Hold all erectile dysfunction medications at least 3 days (72 hrs) prior to test. (Ie  viagra, cialis, sildenafil, tadalafil, etc) We will administer nitroglycerin during this exam.   On the Night Before the Test: Be sure to Drink plenty of water. Do not consume any caffeinated/decaffeinated beverages or chocolate 12 hours prior to your test. Do not take any antihistamines 12 hours prior to your test. If the patient has contrast allergy: Patient will need a prescription for Prednisone and very clear instructions (as follows): Prednisone 50 mg - take 13 hours prior to test Take another Prednisone 50 mg 7 hours prior to test Take another Prednisone 50 mg 1 hour prior to test Take Benadryl 50 mg 1 hour prior to test Patient must complete all four doses of above prophylactic medications. Patient will need a ride after test due to Benadryl.  On the Day of the Test: Drink plenty of water until 1 hour prior to the test. Do not eat any food 1 hour prior to test. You may take your regular medications prior to the test.  Take metoprolol (Lopressor) two hours prior to test. If you take Furosemide/Hydrochlorothiazide/Spironolactone, please HOLD on the morning of the test. FEMALES- please wear underwire-free bra if available, avoid dresses & tight clothing   *For Clinical Staff only. Please instruct patient the following:* Heart Rate Medication Recommendations for Cardiac CT  Resting HR < 50 bpm  No medication  Resting HR 50-60 bpm and BP >110/50 mmHG   Consider Metoprolol tartrate 25 mg PO 90-120 min prior to scan  Resting HR 60-65 bpm and BP >110/50 mmHG  Metoprolol tartrate 50 mg PO 90-120 minutes prior to scan   Resting HR >  65 bpm and BP >110/50 mmHG  Metoprolol tartrate 100 mg PO 90-120 minutes prior to scan  Consider Ivabradine 10-15 mg PO or a calcium channel blocker for resting HR >60 bpm and contraindication to metoprolol tartrate  Consider Ivabradine 10-15 mg PO in combination with metoprolol tartrate for HR >80 bpm         After the Test: Drink plenty of  water. After receiving IV contrast, you may experience a mild flushed feeling. This is normal. On occasion, you may experience a mild rash up to 24 hours after the test. This is not dangerous. If this occurs, you can take Benadryl 25 mg and increase your fluid intake. If you experience trouble breathing, this can be serious. If it is severe call 911 IMMEDIATELY. If it is mild, please call our office. If you take any of these medications: Glipizide/Metformin, Avandament, Glucavance, please do not take 48 hours after completing test unless otherwise instructed.  We will call to schedule your test 2-4 weeks out understanding that some insurance companies will need an authorization prior to the service being performed.   For non-scheduling related questions, please contact the cardiac imaging nurse navigator should you have any questions/concerns: Rockwell Alexandria, Cardiac Imaging Nurse Navigator Larey Brick, Cardiac Imaging Nurse Navigator [facility] Direct Office Dial: [phone]   For scheduling needs, including cancellations and rescheduling, please call [place], [phone].    Follow-Up: At [facility], you and your health needs are our priority.  As part of our continuing mission to provide you with exceptional heart care, we have created designated Provider Care Teams.  These Care Teams include your primary Cardiologist (physician) and Advanced Practice Providers (APPs -  Physician Assistants and Nurse Practitioners) who all work together to provide you with the care you need, when you need it.  We recommend signing up for the patient portal called "MyChart".  Sign up information is provided on this After Visit Summary.  MyChart is used to connect with patients for Virtual Visits (Telemedicine).  Patients are able to view lab/test results, encounter notes, upcoming appointments, etc.  Non-urgent messages can be sent to your provider as well.   To  learn more about what you can do with MyChart, go to [email or web address].    Your next appointment:   6 month(s)  Provider:   Peter Swaziland, MD     Other Instructions

## 2023-04-05 NOTE — Progress Notes (Signed)
Office Visit    Patient Name: Reginald Chambers Date of Encounter: 04/05/2023  Primary Care Provider:  Elfredia Nevins, MD Primary Cardiologist:  Peter Swaziland, MD  Chief Complaint    77 year old male with a history of hypertension, hyperlipidemia, type 2 diabetes, osteoarthritis, agent orange exposure, and GERD who presents for follow-up related to hypertension.  Past Medical History    Past Medical History:  Diagnosis Date   Cancer (HCC)    skin cancer- basal cell CA   Essential hypertension, benign    GERD (gastroesophageal reflux disease)    pt. denies   Hard of hearing    Heart murmur    at birth   History of agent [place] exposure    [place]   History of skin cancer    [place]    Mixed hyperlipidemia    Nocturia    Osteoarthritis    Personal history of colonic polyps - adenoma 04/02/2009   S/P colonoscopy May 2010   7mm sessile polyp, adenoma in ascending colon, mild diverticulosis in sigmoid, internal hemorrhoids, needs repeat in 2015. Performed by Dr. Leone Payor   Tinnitus    SINCE 1968   Type 2 diabetes mellitus [facility])    Past Surgical History:  Procedure Laterality Date   APPENDECTOMY  1959   BILATERAL KNEE ARTHROSCOPY     COLONOSCOPY  04/02/09   7 mm sessile polyp ascending colon/internal hemmorrhoids/mild diverticulosis sigmoid colon   EYE SURGERY Bilateral    cataracts   REVERSE SHOULDER ARTHROPLASTY Left 11/04/2016   REVERSE SHOULDER ARTHROPLASTY Left 11/04/2016   Procedure: LEFT REVERSE SHOULDER ARTHROPLASTY;  Surgeon: Francena Hanly, MD;  Location: [facility] OR;  Service: Orthopedics;  Laterality: Left;   right shoulder arthroscopy     ROTATOR CUFF REPAIR  1990   Left shoulder   TOTAL KNEE ARTHROPLASTY  2007   Left knee   TOTAL KNEE ARTHROPLASTY Right 12/24/2014   Procedure: RIGHT TOTAL KNEE ARTHROPLASTY;  Surgeon: Eugenia Mcalpine, MD;  Location: [facility] ORS;  Service: Orthopedics;  Laterality: Right;    Allergies  Allergies  Allergen Reactions   Penicillins Other  (See Comments)    UNSPECIFIED CAUSE "passed out"  Has patient had a PCN reaction causing immediate rash, facial/tongue/throat swelling, SOB or lightheadedness with hypotension:unknown Has patient had a PCN reaction causing severe rash involving mucus membranes or skin necrosis: No Has patient had a PCN reaction that required hospitalization No Has patient had a PCN reaction occurring within the last 10 years: No If all of the above answers are "NO", then may proceed with Cephalosporin use.      Labs/Other Studies Reviewed    The following studies were reviewed today: Echo 03/2018: Study Conclusions   - Left ventricle: The cavity size was normal. Systolic function was    normal. The estimated ejection fraction was in the range of 60%    to 65%. Wall motion was normal; there were no regional wall    motion abnormalities. Left ventricular diastolic function    parameters were normal.  - Left atrium: The atrium was mildly dilated.  - Atrial septum: No defect or patent foramen ovale was identified.   Exercise myoview 03/2018: The left ventricular ejection fraction is mildly decreased (45-54%). Nuclear stress EF: 51%. Blood pressure demonstrated a normal response to exercise. There was no ST segment deviation noted during stress. The study is normal. This is a low risk study.   Normal resting and stress perfusion. No ischemia or infarction EF 51%  Recent Labs: No results found for requested labs within last 365 days.  Recent Lipid Panel No results found for: "CHOL", "TRIG", "HDL", "CHOLHDL", "VLDL", "LDLCALC", "LDLDIRECT"  History of Present Illness    77 year old male with the above past medical history including hypertension, hyperlipidemia, type 2 diabetes, osteoarthritis, agent [place] exposure, and GERD.  Myoview in 2005 and 2012 were normal.  Echocardiogram and repeat Myoview in 2019 were normal.  He was last seen in the office on 12/18/2021 and was doing well from a  cardiac standpoint.  He reported walking approximately 25 miles.  He denied symptoms concerning for angina. BP was well controled.   He presents today for follow-up. Since his last visist he has stable overall from a cardiac standpoint.  This past weekend he was working in his yard digging a ditch after which he felt exhausted and noted some chest tightness.  He denies any other symptoms concerning for angina.  He continues to play golf regularly, he has not been walking as regularly as he was at his last visit.  Other than his recent episode of chest tightness, he reports feeling well.  Home Medications    Current Outpatient Medications  Medication Sig Dispense Refill   Alogliptin Benzoate 25 MG TABS Take 25 mg by mouth daily.     aspirin 81 MG tablet Take 81 mg by mouth 2 (two) times daily.     empagliflozin (JARDIANCE) 25 MG TABS tablet Take 12.5 mg by mouth daily.      famotidine (PEPCID) 40 MG tablet Take 40 mg by mouth daily.     gabapentin (NEURONTIN) 300 MG capsule TAKE ONE CAPSULE BY MOUTH AT BEDTIME FOR NEUROPATHIC PAIN     glipiZIDE (GLUCOTROL) 5 MG tablet Take 5 mg by mouth 2 (two) times daily.     hydrochlorothiazide (HYDRODIURIL) 25 MG tablet Take 0.5 tablets by mouth daily.     losartan (COZAAR) 100 MG tablet TAKE ONE-HALF TABLET BY MOUTH DAILY (TAKE IN PLACE OF TELMISARTAN)     metFORMIN (GLUCOPHAGE) 1000 MG tablet Take 1,000 mg by mouth 2 (two) times daily.  0   pantoprazole (PROTONIX) 40 MG tablet Take 40 mg by mouth 2 (two) times daily.     simvastatin (ZOCOR) 40 MG tablet Take 40 mg by mouth at bedtime.      GLIPIZIDE PO Take 15 mg by mouth daily before breakfast.  (Patient not taking: Reported on 04/05/2023)     No current facility-administered medications for this visit.     Review of Systems    He denies palpitations, dyspnea, pnd, orthopnea, n, v, dizziness, syncope, edema, weight gain, or early satiety. All other systems reviewed and are otherwise negative except as  noted above.   Physical Exam    VS:  BP 130/70 (BP Location: Left Arm, Patient Position: Sitting, Cuff Size: Normal)   Pulse (!) 56   Ht 5\' 9"  (1.753 m)   Wt 188 lb 9.6 oz (85.5 kg)   SpO2 96%   BMI 27.85 kg/m   GEN: Well nourished, well developed, in no acute distress. HEENT: normal. Neck: Supple, no JVD, carotid bruits, or masses. Cardiac: RRR, no murmurs, rubs, or gallops. No clubbing, cyanosis, edema.  Radials/DP/PT 2+ and equal bilaterally.  Respiratory:  Respirations regular and unlabored, clear to auscultation bilaterally. GI: Soft, nontender, nondistended, BS + x 4. MS: no deformity or atrophy. Skin: warm and dry, no rash. Neuro:  Strength and sensation are intact. Psych: Normal affect.  Accessory Clinical Findings  ECG personally reviewed by me today -sinus bradycardia, 56 bpm- no acute changes.   Lab Results  Component Value Date   WBC 6.5 10/27/2016   HGB 13.7 10/27/2016   HCT 40.7 10/27/2016   MCV 89.8 10/27/2016   PLT 193 10/27/2016   Lab Results  Component Value Date   CREATININE 0.99 10/27/2016   BUN 21 (H) 10/27/2016   NA 138 10/27/2016   K 4.2 10/27/2016   CL 101 10/27/2016   CO2 25 10/27/2016   Lab Results  Component Value Date   ALT 37 10/27/2016   AST 30 10/27/2016   ALKPHOS 61 10/27/2016   BILITOT 0.6 10/27/2016   No results found for: "CHOL", "HDL", "LDLCALC", "LDLDIRECT", "TRIG", "CHOLHDL"  No results found for: "HGBA1C"  Assessment & Plan   1. Chest tightness/fatigue: Recent episode of chest tightness while digging a ditch in his yard.  Denies any other symptoms concerning for angina.  Prior stress test reassuring.  Through shared decision-making, will pursue coronary CT angiogram for further restratification.  Will update BMET. Continue to monitor symptoms.  2. Hypertension: BP well controlled. Continue current antihypertensive regimen.   3. Hyperlipidemia: LDL was 62 in 07/2022.  Continue simvastatin.  4. Type 2 diabetes: A1c was  6.4 in 09/2022.  Monitored and managed per PCP.  5. Disposition: Follow-up in 6 months with Dr. Swaziland.      Joylene Grapes, NP 04/05/2023, 11:41 AM

## 2023-04-06 DIAGNOSIS — I10 Essential (primary) hypertension: Secondary | ICD-10-CM | POA: Diagnosis not present

## 2023-04-06 DIAGNOSIS — Z08 Encounter for follow-up examination after completed treatment for malignant neoplasm: Secondary | ICD-10-CM | POA: Diagnosis not present

## 2023-04-06 DIAGNOSIS — R072 Precordial pain: Secondary | ICD-10-CM | POA: Diagnosis not present

## 2023-04-06 DIAGNOSIS — Z85828 Personal history of other malignant neoplasm of skin: Secondary | ICD-10-CM | POA: Diagnosis not present

## 2023-04-06 DIAGNOSIS — D0472 Carcinoma in situ of skin of left lower limb, including hip: Secondary | ICD-10-CM | POA: Diagnosis not present

## 2023-04-06 DIAGNOSIS — E782 Mixed hyperlipidemia: Secondary | ICD-10-CM | POA: Diagnosis not present

## 2023-04-06 DIAGNOSIS — X32XXXD Exposure to sunlight, subsequent encounter: Secondary | ICD-10-CM | POA: Diagnosis not present

## 2023-04-06 DIAGNOSIS — E119 Type 2 diabetes mellitus without complications: Secondary | ICD-10-CM | POA: Diagnosis not present

## 2023-04-06 DIAGNOSIS — L570 Actinic keratosis: Secondary | ICD-10-CM | POA: Diagnosis not present

## 2023-04-07 LAB — BASIC METABOLIC PANEL
BUN/Creatinine Ratio: 21 (ref 10–24)
BUN: 26 mg/dL (ref 8–27)
CO2: 21 mmol/L (ref 20–29)
Calcium: 10 mg/dL (ref 8.6–10.2)
Chloride: 98 mmol/L (ref 96–106)
Creatinine, Ser: 1.24 mg/dL (ref 0.76–1.27)
Glucose: 126 mg/dL — ABNORMAL HIGH (ref 70–99)
Potassium: 4.6 mmol/L (ref 3.5–5.2)
Sodium: 140 mmol/L (ref 134–144)
eGFR: 61 mL/min/{1.73_m2} (ref >59)

## 2023-04-11 ENCOUNTER — Telehealth: Payer: Self-pay

## 2023-04-11 ENCOUNTER — Ambulatory Visit (HOSPITAL_COMMUNITY): Admission: RE | Admit: 2023-04-11 | Payer: Medicare Other | Source: Ambulatory Visit

## 2023-04-11 NOTE — Telephone Encounter (Signed)
Spoke with pt. Pt was notified of lab results. Pt will continue his current medication and f/u as planned.  

## 2023-04-20 ENCOUNTER — Telehealth (HOSPITAL_COMMUNITY): Payer: Self-pay | Admitting: *Deleted

## 2023-04-20 NOTE — Telephone Encounter (Signed)
Attempted to call patient regarding upcoming cardiac CT appointment. °Left message on voicemail with name and callback number ° °Jaksen Fiorella RN Navigator Cardiac Imaging °[facility] °[phone] Office °[phone] Cell ° °

## 2023-04-21 ENCOUNTER — Ambulatory Visit (HOSPITAL_COMMUNITY)
Admission: RE | Admit: 2023-04-21 | Discharge: 2023-04-21 | Disposition: A | Discharge status: Home / Self Care | Payer: Medicare Other | Source: Ambulatory Visit | Attending: Nurse Practitioner | Admitting: Nurse Practitioner

## 2023-04-21 ENCOUNTER — Ambulatory Visit (HOSPITAL_BASED_OUTPATIENT_CLINIC_OR_DEPARTMENT_OTHER)
Admission: RE | Admit: 2023-04-21 | Discharge: 2023-04-21 | Disposition: A | Discharge status: Home / Self Care | Payer: Medicare Other | Source: Ambulatory Visit | Attending: Cardiovascular Disease | Admitting: Cardiovascular Disease

## 2023-04-21 ENCOUNTER — Other Ambulatory Visit: Payer: Self-pay | Admitting: Cardiovascular Disease

## 2023-04-21 DIAGNOSIS — I10 Essential (primary) hypertension: Secondary | ICD-10-CM | POA: Insufficient documentation

## 2023-04-21 DIAGNOSIS — I2510 Atherosclerotic heart disease of native coronary artery without angina pectoris: Secondary | ICD-10-CM

## 2023-04-21 DIAGNOSIS — I700 Atherosclerosis of aorta: Secondary | ICD-10-CM | POA: Insufficient documentation

## 2023-04-21 DIAGNOSIS — E785 Hyperlipidemia, unspecified: Secondary | ICD-10-CM | POA: Insufficient documentation

## 2023-04-21 DIAGNOSIS — R931 Abnormal findings on diagnostic imaging of heart and coronary circulation: Secondary | ICD-10-CM | POA: Insufficient documentation

## 2023-04-21 DIAGNOSIS — R072 Precordial pain: Secondary | ICD-10-CM | POA: Diagnosis not present

## 2023-04-21 DIAGNOSIS — E119 Type 2 diabetes mellitus without complications: Secondary | ICD-10-CM | POA: Diagnosis not present

## 2023-04-21 MED ORDER — IOHEXOL 350 MG/ML SOLN
95.0000 mL | Freq: Once | INTRAVENOUS | Status: AC | PRN
  Administered 2023-04-21: 95 mL via INTRAVENOUS

## 2023-04-21 MED ORDER — NITROGLYCERIN 0.4 MG SL SUBL
SUBLINGUAL_TABLET | SUBLINGUAL | Status: AC
  Filled 2023-04-21: qty 2

## 2023-04-21 MED ORDER — NITROGLYCERIN 0.4 MG SL SUBL
0.8000 mg | SUBLINGUAL_TABLET | SUBLINGUAL | Status: DC | PRN
  Administered 2023-04-21: 0.8 mg via SUBLINGUAL

## 2023-04-21 NOTE — Progress Notes (Signed)
Patient tolerated without distress 

## 2023-04-27 ENCOUNTER — Telehealth: Payer: Self-pay

## 2023-04-27 NOTE — Telephone Encounter (Signed)
Lmom to discuss CT results. Waiting on a return call.  

## 2023-04-28 NOTE — Telephone Encounter (Signed)
Patient is returning call to get results. Requesting call back.

## 2023-04-28 NOTE — Telephone Encounter (Signed)
Patient aware of CT results. Verbalized understanding 

## 2023-05-04 ENCOUNTER — Telehealth: Payer: Self-pay | Admitting: Nurse Practitioner

## 2023-05-04 DIAGNOSIS — A084 Viral intestinal infection, unspecified: Secondary | ICD-10-CM | POA: Diagnosis not present

## 2023-05-04 NOTE — Telephone Encounter (Signed)
Patient is returning CMA's call for his CT results. Please advise.

## 2023-05-04 NOTE — Telephone Encounter (Signed)
Spoke with the patient, explained CT coronary calcium score:   Recent coronary CT angiogram showed coronary calcium Score of 1252 (82nd percentile), moderate nonobstructive coronary artery disease.  We will continue to monitor symptoms, continue secondary prevention with cholesterol management, BP control.  Please notify us if you have any worsening chest pain or shortness of breath.  For now, continue current medications and follow-up as planned    Pt voiced understanding

## 2023-05-06 DIAGNOSIS — A084 Viral intestinal infection, unspecified: Secondary | ICD-10-CM | POA: Diagnosis not present

## 2023-05-06 DIAGNOSIS — K219 Gastro-esophageal reflux disease without esophagitis: Secondary | ICD-10-CM | POA: Diagnosis not present

## 2023-05-11 DIAGNOSIS — L570 Actinic keratosis: Secondary | ICD-10-CM | POA: Diagnosis not present

## 2023-05-11 DIAGNOSIS — Z85828 Personal history of other malignant neoplasm of skin: Secondary | ICD-10-CM | POA: Diagnosis not present

## 2023-05-11 DIAGNOSIS — X32XXXD Exposure to sunlight, subsequent encounter: Secondary | ICD-10-CM | POA: Diagnosis not present

## 2023-05-11 DIAGNOSIS — Z08 Encounter for follow-up examination after completed treatment for malignant neoplasm: Secondary | ICD-10-CM | POA: Diagnosis not present

## 2023-05-17 DIAGNOSIS — Z1211 Encounter for screening for malignant neoplasm of colon: Secondary | ICD-10-CM | POA: Diagnosis not present

## 2023-09-13 DIAGNOSIS — L570 Actinic keratosis: Secondary | ICD-10-CM | POA: Diagnosis not present

## 2023-09-13 DIAGNOSIS — B078 Other viral warts: Secondary | ICD-10-CM | POA: Diagnosis not present

## 2023-09-13 DIAGNOSIS — H00019 Hordeolum externum unspecified eye, unspecified eyelid: Secondary | ICD-10-CM | POA: Diagnosis not present

## 2023-09-13 DIAGNOSIS — L820 Inflamed seborrheic keratosis: Secondary | ICD-10-CM | POA: Diagnosis not present

## 2023-09-13 DIAGNOSIS — X32XXXD Exposure to sunlight, subsequent encounter: Secondary | ICD-10-CM | POA: Diagnosis not present

## 2023-10-18 NOTE — Progress Notes (Signed)
Cardiology Office Note   Date:  10/21/2023   ID:  Reginald Chambers, DOB 1946/07/14, MRN [id number]  PCP:  Elfredia Nevins, MD  Cardiologist:   Briena Swingler [place], MD   Chief Complaint  Patient presents with   Coronary Artery Disease      History of Present Illness: Reginald Chambers is a 77 y.o. male who presents for follow up.[name] He has a history of DM type 2, hypercholesterolemia, and HTN. He had prior cardiac evaluation in 2005 and 2012 with normal stress Myoview studies. Repeat Echo and Myoview studies in 2019 were normal.  He was seen in May with some chest tightness while digging a ditch. Coronary CTA done showing nonobstructive CAD with high coronary calcium score.  On follow up today he is doing well. No chest pain, dyspnea or palpitations. Active playing golf and raking leaves. Has gained weight. Labs monitored by PCP. BP at home typically 122/68  Past Medical History:  Diagnosis Date   Cancer (HCC)    skin cancer- basal cell CA   Essential hypertension, benign    GERD (gastroesophageal reflux disease)    pt. denies   Hard of hearing    Heart murmur    at birth   History of agent Orange exposure    [place]   History of skin cancer    Malaria    Mixed hyperlipidemia    Nocturia    Osteoarthritis    Personal history of colonic polyps - adenoma 04/02/2009   S/P colonoscopy May 2010   7mm sessile polyp, adenoma in ascending colon, mild diverticulosis in sigmoid, internal hemorrhoids, needs repeat in 2015. Performed by Dr. Leone Payor   Tinnitus    SINCE 1968   Type 2 diabetes mellitus [facility])     Past Surgical History:  Procedure Laterality Date   APPENDECTOMY  1959   BILATERAL KNEE ARTHROSCOPY     COLONOSCOPY  04/02/09   7 mm sessile polyp ascending colon/internal hemmorrhoids/mild diverticulosis sigmoid colon   EYE SURGERY Bilateral    cataracts   REVERSE SHOULDER ARTHROPLASTY Left 11/04/2016   REVERSE SHOULDER ARTHROPLASTY Left 11/04/2016   Procedure: LEFT REVERSE  SHOULDER ARTHROPLASTY;  Surgeon: Francena Hanly, MD;  Location: [facility];  Service: Orthopedics;  Laterality: Left;   right shoulder arthroscopy     ROTATOR CUFF REPAIR  1990   Left shoulder   TOTAL KNEE ARTHROPLASTY  2007   Left knee   TOTAL KNEE ARTHROPLASTY Right 12/24/2014   Procedure: RIGHT TOTAL KNEE ARTHROPLASTY;  Surgeon: Eugenia Mcalpine, MD;  Location: [facility];  Service: Orthopedics;  Laterality: Right;     Current Outpatient Medications  Medication Sig Dispense Refill   Alogliptin Benzoate 25 MG TABS Take 25 mg by mouth daily.     aspirin 81 MG tablet Take 81 mg by mouth 2 (two) times daily.     empagliflozin (JARDIANCE) 25 MG TABS tablet Take 12.5 mg by mouth daily.      famotidine (PEPCID) 40 MG tablet Take 40 mg by mouth daily.     gabapentin (NEURONTIN) 300 MG capsule TAKE ONE CAPSULE BY MOUTH AT BEDTIME FOR NEUROPATHIC PAIN     glipiZIDE (GLUCOTROL) 5 MG tablet Take 5 mg by mouth 2 (two) times daily.     hydrochlorothiazide (HYDRODIURIL) 25 MG tablet Take 0.5 tablets by mouth daily.     losartan (COZAAR) 100 MG tablet TAKE ONE-HALF TABLET BY MOUTH DAILY (TAKE IN PLACE OF TELMISARTAN)     metFORMIN (GLUCOPHAGE) 1000 MG tablet  Take 1,000 mg by mouth 2 (two) times daily.  0   pantoprazole (PROTONIX) 40 MG tablet Take 40 mg by mouth 2 (two) times daily.     simvastatin (ZOCOR) 40 MG tablet Take 40 mg by mouth at bedtime.      No current facility-administered medications for this visit.    Allergies:   Penicillins    Social History:  The patient  reports that he quit smoking about 32 years ago. His smoking use included cigarettes. He started smoking about 37 years ago. He has a 5 pack-year smoking history. He has never used smokeless tobacco. He reports current alcohol use of about 2.0 standard drinks of alcohol per week. He reports that he does not use drugs.   Family History:  The patient's family history includes Diabetes type II in his mother; Heart failure in his mother;  Lymphoma in his father.    ROS:  Please see the history of present illness.   Otherwise, review of systems are positive for none.   All other systems are reviewed and negative.    PHYSICAL EXAM: VS:  BP 134/64 (BP Location: Right Arm)   Pulse 61   Ht 5\' 9"  (1.753 m)   Wt 196 lb (88.9 kg)   SpO2 94%   BMI 28.94 kg/m  , BMI Body mass index is 28.94 kg/m. GEN: Well nourished, obese, in no acute distress  HEENT: normal  Neck: no JVD, carotid bruits, or masses Cardiac: RRR; no murmurs, rubs, or gallops,no edema. Pedal pulses 2+ Respiratory:  clear to auscultation bilaterally, normal work of breathing GI: soft, nontender, nondistended, + BS MS: no deformity or atrophy. Old surgical scars on knees.  Skin: warm and dry, no rash, no edema.  Neuro:  Strength and sensation are intact Psych: euthymic mood, full affect   EKG:  EKG is not ordered today.   Coronary CTA 04/21/23:Cardiac/Coronary  CT   TECHNIQUE: The patient was scanned on a [employer].   FINDINGS: A 120 kV prospective scan was triggered in the descending thoracic aorta at 111 HU's. Axial non-contrast 3 mm slices were carried out through the heart. The data set was analyzed on a dedicated work station and scored using the Agatson method. Gantry rotation speed was 250 msecs and collimation was .6 mm. No beta blockade and 0.8 mg of sl NTG was given. The 3D data set was reconstructed in 5% intervals of the 67-82 % of the R-R cycle. Diastolic phases were analyzed on a dedicated work station using MPR, MIP and VRT modes. The patient received 80 cc of contrast.   Aorta: Normal size. Ascending aorta 3.8 cm. Aortic atherosclerosis. No dissection.   Aortic Valve: Trileaflet.  Mild calcification.   Coronary Arteries:  Normal coronary origin.  Right dominance.   RCA is a large dominant artery that gives rise to PDA and PLVB. There is diffuse minimally obstructive (<25%) disease.   Left main is a large artery  that gives rise to LAD and LCX arteries.   LAD is a large vessel that has minimal (<25%) calcified plaque proximally, mild (25-49%) calcified plaque between D1 and D2, and minimal plaque distally. D1 has moderate (50-69%) plaque in the mid vessel. D2 has moderate (50-69%) calcified plaque at the ostium.   LCX is a non-dominant artery that has mild (25-49%) plaque. It gives rise to two OM branches.   Coronary Calcium Score:   Left main: 0   Left anterior descending artery: 600   Left circumflex  artery: 365   Right coronary artery: 287   Total: 1252   Percentile: 82nd   Other findings:   Normal pulmonary vein drainage into the left atrium.   Normal let atrial appendage without a thrombus.   Normal size of the pulmonary artery.   IMPRESSION: 1. Coronary calcium score of 1252. This was 82nd percentile for age-, sex, and race-matched controls.   2. Total plaque volume 1492 mm3 which is 87th percentile for age- and sex-matched controls (calcified plaque 249 mm3; non-calcified plaque 1243 mm3). TPV is (extensive).   2. Normal coronary origin with right dominance.   3. Normal coronary arteries.  FFRCT ANALYSIS for abnormal coronary CTA   FINDINGS: FFRct analysis was performed on the original cardiac CT angiogram dataset. Diagrammatic representation of the FFRct analysis is provided in a separate PDF document in PACS. This dictation was created using the PDF document and an interactive 3D model of the results. 3D model is not available in the EMR/PACS. Normal FFR range is >0.80.   1. Left Main: FFR CT 1.0   2. LAD: FFR CT 0.98 proximal, 0.88 mid, 0.79 distal. D1 FFR CT 0.97 proximal, 0.76 mid. D2 FFR CT 0.91   3. LCX: FFR CT 0.98 proximal, 0.92 mid   4. RCA: FFR CT 0.99 proximal, 0.97 mid, 0.97 distal   IMPRESSION: 1. FFR CT findings are inconclusive in D1. D2 consistent with nonobstructive CAD   2. Consider functional testing or invasive angiography if  clinically indicated.   Tiffany C. Duke Salvia, MD    Recent Labs: 04/06/2023: BUN 26; Creatinine, Ser 1.24; Potassium 4.6; Sodium 140    Lipid Panel No results found for: "CHOL", "TRIG", "HDL", "CHOLHDL", "VLDL", "LDLCALC", "LDLDIRECT"   Dated 12/10/19: cholesterol 137, triglycerides 77, HDL 50, LDL 72. Creatinine 1.07. otherwise CMET and CBC normal.  Dated 07/16/20: A1c 7.4% Dated 01/01/21: cholesterol 125, triglycerides 89, HDL 44, LDL 64. A1c 7.7%.CMET and CBC normal Dated 08/19/22: cholesterol 128, triglycerides 81, HDL 50, LDL 62. A1c 6.4%, CBC, CMET and TSH normal   Wt Readings from Last 3 Encounters:  10/21/23 196 lb (88.9 kg)  04/05/23 188 lb 9.6 oz (85.5 kg)  12/18/21 196 lb (88.9 kg)      Other studies Reviewed: Additional studies/ records that were reviewed today include: old stress Myoview studies from 2005 and 2012.[name] Review of the above records demonstrates: Normal perfusion. EF 56%.   Myoview 04/04/18: Study Highlights    The left ventricular ejection fraction is mildly decreased (45-54%). Nuclear stress EF: 51%. Blood pressure demonstrated a normal response to exercise. There was no ST segment deviation noted during stress. The study is normal. This is a low risk study.   Normal resting and stress perfusion. No ischemia or infarction EF 51%   Echo 04/03/18: Study Conclusions   - Left ventricle: The cavity size was normal. Systolic function was    normal. The estimated ejection fraction was in the range of 60%    to 65%. Wall motion was normal; there were no regional wall    motion abnormalities. Left ventricular diastolic function    parameters were normal.  - Left atrium: The atrium was mildly dilated.  - Atrial septum: No defect or patent foramen ovale was identified.    ASSESSMENT AND PLAN:  1. HTN controlled.  2. DM type 2 on Jardiance, metformin, Glipizide.  per primary care.  3. Hyperlipidemia. On statin therapy. Request a copy of most recent labs.    4. CAD with high coronary  calcium score but nonobstructive disease. Continue risk factor modification    Current medicines are reviewed at length with the patient today.  The patient does not have concerns regarding medicines.  The following changes have been made:  no change  Labs/ tests ordered today include:  No orders of the defined types were placed in this encounter.    Disposition:   FU one year  Signed, Kalyb Pemble [place], MD  10/21/2023 8:36 AM    [facility] [address] [place], [place], [address] Phone [phone], Fax [phone]

## 2023-10-21 ENCOUNTER — Encounter: Payer: Self-pay | Admitting: Cardiology

## 2023-10-21 ENCOUNTER — Ambulatory Visit: Payer: Medicare PPO | Attending: Cardiology | Admitting: Cardiology

## 2023-10-21 VITALS — BP 134/64 | HR 61 | Ht 69.0 in | Wt 196.0 lb

## 2023-10-21 DIAGNOSIS — I10 Essential (primary) hypertension: Secondary | ICD-10-CM | POA: Diagnosis not present

## 2023-10-21 DIAGNOSIS — E782 Mixed hyperlipidemia: Secondary | ICD-10-CM | POA: Diagnosis not present

## 2023-10-21 DIAGNOSIS — E119 Type 2 diabetes mellitus without complications: Secondary | ICD-10-CM | POA: Diagnosis not present

## 2023-10-21 DIAGNOSIS — I2510 Atherosclerotic heart disease of native coronary artery without angina pectoris: Secondary | ICD-10-CM

## 2023-10-21 NOTE — Patient Instructions (Signed)
 Medication Instructions:  Continue same medications *If you need a refill on your cardiac medications before your next appointment, please call your pharmacy*   Lab Work: None ordered   Testing/Procedures: None ordered   Follow-Up: At [facility], you and your health needs are our priority.  As part of our continuing mission to provide you with exceptional heart care, we have created designated Provider Care Teams.  These Care Teams include your primary Cardiologist (physician) and Advanced Practice Providers (APPs -  Physician Assistants and Nurse Practitioners) who all work together to provide you with the care you need, when you need it.  We recommend signing up for the patient portal called "MyChart".  Sign up information is provided on this After Visit Summary.  MyChart is used to connect with patients for Virtual Visits (Telemedicine).  Patients are able to view lab/test results, encounter notes, upcoming appointments, etc.  Non-urgent messages can be sent to your provider as well.   To learn more about what you can do with MyChart, go to [email or web address].    Your next appointment:  1 year  Call in July to schedule Nov appointment    Provider:  Dr.Jordan

## 2023-11-16 DIAGNOSIS — H524 Presbyopia: Secondary | ICD-10-CM | POA: Diagnosis not present

## 2023-11-16 DIAGNOSIS — H43392 Other vitreous opacities, left eye: Secondary | ICD-10-CM | POA: Diagnosis not present

## 2023-11-24 DIAGNOSIS — J0100 Acute maxillary sinusitis, unspecified: Secondary | ICD-10-CM | POA: Diagnosis not present

## 2023-11-24 DIAGNOSIS — E1140 Type 2 diabetes mellitus with diabetic neuropathy, unspecified: Secondary | ICD-10-CM | POA: Diagnosis not present

## 2023-11-24 DIAGNOSIS — E1165 Type 2 diabetes mellitus with hyperglycemia: Secondary | ICD-10-CM | POA: Diagnosis not present

## 2023-11-24 DIAGNOSIS — M1991 Primary osteoarthritis, unspecified site: Secondary | ICD-10-CM | POA: Diagnosis not present

## 2023-11-24 DIAGNOSIS — Z6828 Body mass index (BMI) 28.0-28.9, adult: Secondary | ICD-10-CM | POA: Diagnosis not present

## 2023-11-24 DIAGNOSIS — I10 Essential (primary) hypertension: Secondary | ICD-10-CM | POA: Diagnosis not present

## 2023-11-24 DIAGNOSIS — E663 Overweight: Secondary | ICD-10-CM | POA: Diagnosis not present

## 2023-12-13 DIAGNOSIS — M47812 Spondylosis without myelopathy or radiculopathy, cervical region: Secondary | ICD-10-CM | POA: Diagnosis not present

## 2023-12-13 DIAGNOSIS — M9902 Segmental and somatic dysfunction of thoracic region: Secondary | ICD-10-CM | POA: Diagnosis not present

## 2023-12-13 DIAGNOSIS — M9903 Segmental and somatic dysfunction of lumbar region: Secondary | ICD-10-CM | POA: Diagnosis not present

## 2023-12-13 DIAGNOSIS — M9901 Segmental and somatic dysfunction of cervical region: Secondary | ICD-10-CM | POA: Diagnosis not present

## 2023-12-13 DIAGNOSIS — M531 Cervicobrachial syndrome: Secondary | ICD-10-CM | POA: Diagnosis not present

## 2023-12-13 DIAGNOSIS — S233XXA Sprain of ligaments of thoracic spine, initial encounter: Secondary | ICD-10-CM | POA: Diagnosis not present

## 2023-12-13 DIAGNOSIS — M47816 Spondylosis without myelopathy or radiculopathy, lumbar region: Secondary | ICD-10-CM | POA: Diagnosis not present

## 2023-12-15 DIAGNOSIS — S233XXA Sprain of ligaments of thoracic spine, initial encounter: Secondary | ICD-10-CM | POA: Diagnosis not present

## 2023-12-15 DIAGNOSIS — M47816 Spondylosis without myelopathy or radiculopathy, lumbar region: Secondary | ICD-10-CM | POA: Diagnosis not present

## 2023-12-15 DIAGNOSIS — M531 Cervicobrachial syndrome: Secondary | ICD-10-CM | POA: Diagnosis not present

## 2023-12-15 DIAGNOSIS — M47812 Spondylosis without myelopathy or radiculopathy, cervical region: Secondary | ICD-10-CM | POA: Diagnosis not present

## 2023-12-15 DIAGNOSIS — M9901 Segmental and somatic dysfunction of cervical region: Secondary | ICD-10-CM | POA: Diagnosis not present

## 2023-12-15 DIAGNOSIS — M9902 Segmental and somatic dysfunction of thoracic region: Secondary | ICD-10-CM | POA: Diagnosis not present

## 2023-12-15 DIAGNOSIS — M9903 Segmental and somatic dysfunction of lumbar region: Secondary | ICD-10-CM | POA: Diagnosis not present

## 2024-03-02 DIAGNOSIS — M531 Cervicobrachial syndrome: Secondary | ICD-10-CM | POA: Diagnosis not present

## 2024-03-02 DIAGNOSIS — M47816 Spondylosis without myelopathy or radiculopathy, lumbar region: Secondary | ICD-10-CM | POA: Diagnosis not present

## 2024-03-02 DIAGNOSIS — M9903 Segmental and somatic dysfunction of lumbar region: Secondary | ICD-10-CM | POA: Diagnosis not present

## 2024-03-02 DIAGNOSIS — S233XXA Sprain of ligaments of thoracic spine, initial encounter: Secondary | ICD-10-CM | POA: Diagnosis not present

## 2024-03-02 DIAGNOSIS — M9902 Segmental and somatic dysfunction of thoracic region: Secondary | ICD-10-CM | POA: Diagnosis not present

## 2024-03-02 DIAGNOSIS — M47812 Spondylosis without myelopathy or radiculopathy, cervical region: Secondary | ICD-10-CM | POA: Diagnosis not present

## 2024-03-02 DIAGNOSIS — M9901 Segmental and somatic dysfunction of cervical region: Secondary | ICD-10-CM | POA: Diagnosis not present

## 2024-03-05 DIAGNOSIS — M47816 Spondylosis without myelopathy or radiculopathy, lumbar region: Secondary | ICD-10-CM | POA: Diagnosis not present

## 2024-03-05 DIAGNOSIS — M531 Cervicobrachial syndrome: Secondary | ICD-10-CM | POA: Diagnosis not present

## 2024-03-05 DIAGNOSIS — M9903 Segmental and somatic dysfunction of lumbar region: Secondary | ICD-10-CM | POA: Diagnosis not present

## 2024-03-05 DIAGNOSIS — M9902 Segmental and somatic dysfunction of thoracic region: Secondary | ICD-10-CM | POA: Diagnosis not present

## 2024-03-05 DIAGNOSIS — M47812 Spondylosis without myelopathy or radiculopathy, cervical region: Secondary | ICD-10-CM | POA: Diagnosis not present

## 2024-03-05 DIAGNOSIS — S233XXA Sprain of ligaments of thoracic spine, initial encounter: Secondary | ICD-10-CM | POA: Diagnosis not present

## 2024-03-05 DIAGNOSIS — M9901 Segmental and somatic dysfunction of cervical region: Secondary | ICD-10-CM | POA: Diagnosis not present

## 2024-03-19 DIAGNOSIS — M531 Cervicobrachial syndrome: Secondary | ICD-10-CM | POA: Diagnosis not present

## 2024-03-19 DIAGNOSIS — M47816 Spondylosis without myelopathy or radiculopathy, lumbar region: Secondary | ICD-10-CM | POA: Diagnosis not present

## 2024-03-19 DIAGNOSIS — M9901 Segmental and somatic dysfunction of cervical region: Secondary | ICD-10-CM | POA: Diagnosis not present

## 2024-03-19 DIAGNOSIS — M9902 Segmental and somatic dysfunction of thoracic region: Secondary | ICD-10-CM | POA: Diagnosis not present

## 2024-03-19 DIAGNOSIS — M9903 Segmental and somatic dysfunction of lumbar region: Secondary | ICD-10-CM | POA: Diagnosis not present

## 2024-03-19 DIAGNOSIS — S233XXA Sprain of ligaments of thoracic spine, initial encounter: Secondary | ICD-10-CM | POA: Diagnosis not present

## 2024-03-19 DIAGNOSIS — M47812 Spondylosis without myelopathy or radiculopathy, cervical region: Secondary | ICD-10-CM | POA: Diagnosis not present

## 2024-03-20 DIAGNOSIS — X32XXXD Exposure to sunlight, subsequent encounter: Secondary | ICD-10-CM | POA: Diagnosis not present

## 2024-03-20 DIAGNOSIS — L570 Actinic keratosis: Secondary | ICD-10-CM | POA: Diagnosis not present

## 2024-03-21 DIAGNOSIS — M47816 Spondylosis without myelopathy or radiculopathy, lumbar region: Secondary | ICD-10-CM | POA: Diagnosis not present

## 2024-03-21 DIAGNOSIS — S233XXA Sprain of ligaments of thoracic spine, initial encounter: Secondary | ICD-10-CM | POA: Diagnosis not present

## 2024-03-21 DIAGNOSIS — M531 Cervicobrachial syndrome: Secondary | ICD-10-CM | POA: Diagnosis not present

## 2024-03-21 DIAGNOSIS — M9901 Segmental and somatic dysfunction of cervical region: Secondary | ICD-10-CM | POA: Diagnosis not present

## 2024-03-21 DIAGNOSIS — M9902 Segmental and somatic dysfunction of thoracic region: Secondary | ICD-10-CM | POA: Diagnosis not present

## 2024-03-21 DIAGNOSIS — M47812 Spondylosis without myelopathy or radiculopathy, cervical region: Secondary | ICD-10-CM | POA: Diagnosis not present

## 2024-03-21 DIAGNOSIS — M9903 Segmental and somatic dysfunction of lumbar region: Secondary | ICD-10-CM | POA: Diagnosis not present

## 2024-03-29 DIAGNOSIS — M47812 Spondylosis without myelopathy or radiculopathy, cervical region: Secondary | ICD-10-CM | POA: Diagnosis not present

## 2024-03-29 DIAGNOSIS — M9902 Segmental and somatic dysfunction of thoracic region: Secondary | ICD-10-CM | POA: Diagnosis not present

## 2024-03-29 DIAGNOSIS — M531 Cervicobrachial syndrome: Secondary | ICD-10-CM | POA: Diagnosis not present

## 2024-03-29 DIAGNOSIS — M9901 Segmental and somatic dysfunction of cervical region: Secondary | ICD-10-CM | POA: Diagnosis not present

## 2024-03-29 DIAGNOSIS — M47816 Spondylosis without myelopathy or radiculopathy, lumbar region: Secondary | ICD-10-CM | POA: Diagnosis not present

## 2024-03-29 DIAGNOSIS — S233XXA Sprain of ligaments of thoracic spine, initial encounter: Secondary | ICD-10-CM | POA: Diagnosis not present

## 2024-03-29 DIAGNOSIS — M9903 Segmental and somatic dysfunction of lumbar region: Secondary | ICD-10-CM | POA: Diagnosis not present

## 2024-07-17 DIAGNOSIS — X32XXXD Exposure to sunlight, subsequent encounter: Secondary | ICD-10-CM | POA: Diagnosis not present

## 2024-07-17 DIAGNOSIS — L570 Actinic keratosis: Secondary | ICD-10-CM | POA: Diagnosis not present

## 2024-08-13 DIAGNOSIS — H26493 Other secondary cataract, bilateral: Secondary | ICD-10-CM | POA: Diagnosis not present

## 2024-08-21 ENCOUNTER — Encounter: Payer: Self-pay | Admitting: Internal Medicine

## 2024-10-03 ENCOUNTER — Ambulatory Visit (INDEPENDENT_AMBULATORY_CARE_PROVIDER_SITE_OTHER)

## 2024-10-03 ENCOUNTER — Encounter (INDEPENDENT_AMBULATORY_CARE_PROVIDER_SITE_OTHER): Payer: Self-pay

## 2024-10-03 VITALS — BP 155/92 | HR 99 | Ht 69.0 in | Wt 177.0 lb

## 2024-10-03 DIAGNOSIS — R7981 Abnormal blood-gas level: Secondary | ICD-10-CM

## 2024-10-03 DIAGNOSIS — R09A0 Foreign body sensation, unspecified: Secondary | ICD-10-CM | POA: Diagnosis not present

## 2024-10-03 DIAGNOSIS — R0989 Other specified symptoms and signs involving the circulatory and respiratory systems: Secondary | ICD-10-CM | POA: Diagnosis not present

## 2024-10-03 DIAGNOSIS — R053 Chronic cough: Secondary | ICD-10-CM | POA: Diagnosis not present

## 2024-10-03 NOTE — Progress Notes (Signed)
 Dear Dr. Bertell, Here is my assessment for our mutual patient, Reginald Chambers. Thank you for allowing me the opportunity to care for your patient. Please do not hesitate to contact me should you have any other questions. Sincerely, Dr. Hadassah Parody  Otolaryngology Clinic Note Referring provider: Dr. Bertell HPI:   Initial HPI (10/03/2024) Discussed the use of AI scribe software for clinical note transcription with the patient, who gave verbal consent to proceed.  History of Present Illness Reginald Chambers is a 78 year old male with chronic cough who presents with chronic throat clearing over past few years as well as worsening cough over the past month.   Chronic throat clearing present for years, attributed previously by ENT to acid reflux. Took reflux medications and has not been helpful. Over the last month started having cough, sometimes productive. He went to PCP who ordered CXR that showed bilateral lung base opacities. He was given a z-pack but cough has not abated and feels like he is starting to feel worse. They are measuring his O2 sats at home and they get into the 70s-80s at night time, usually running in 90s otherwise.  Reporting significant dyspnea. Symptoms have progressively worsened over the past week.   - No nasal obstruction, nasal drainage  - No dysphagia - No significant nasal allergies, though occasional mild allergies occur  Environmental and occupational exposures - History of tobacco use from 1968 to 1972; no tobacco use since - Exposure to [employer] in 1968 during [place] - disabled veteran   Independent Review of Additional Tests or Records:  CXR 09/17/24: early infectious vs inflammatory process in bilateral lung base left > right  Referral (09/18/24) from Jerilynn Bertell, MD: LPR and chronic throat clearing, on daily PPI  WBC 06/05/2024: 7.9   PMH/Meds/All/SocHx/FamHx/ROS:   Past Medical History:  Diagnosis Date   Cancer (HCC)    skin  cancer- basal cell CA   Essential hypertension, benign    GERD (gastroesophageal reflux disease)    pt. denies   Hard of hearing    Heart murmur    at birth   History of agent Orange exposure    [place]   History of skin cancer    Malaria    Mixed hyperlipidemia    Nocturia    Osteoarthritis    Personal history of colonic polyps - adenoma 04/02/2009   S/P colonoscopy May 2010   7mm sessile polyp, adenoma in ascending colon, mild diverticulosis in sigmoid, internal hemorrhoids, needs repeat in 2015. Performed by Dr. Avram   Tinnitus    SINCE 1968   Type 2 diabetes mellitus [facility])      Past Surgical History:  Procedure Laterality Date   APPENDECTOMY  1959   BILATERAL KNEE ARTHROSCOPY     COLONOSCOPY  04/02/09   7 mm sessile polyp ascending colon/internal hemmorrhoids/mild diverticulosis sigmoid colon   EYE SURGERY Bilateral    cataracts   REVERSE SHOULDER ARTHROPLASTY Left 11/04/2016   REVERSE SHOULDER ARTHROPLASTY Left 11/04/2016   Procedure: LEFT REVERSE SHOULDER ARTHROPLASTY;  Surgeon: Franky Pointer, MD;  Location: [facility] OR;  Service: Orthopedics;  Laterality: Left;   right shoulder arthroscopy     ROTATOR CUFF REPAIR  1990   Left shoulder   TOTAL KNEE ARTHROPLASTY  2007   Left knee   TOTAL KNEE ARTHROPLASTY Right 12/24/2014   Procedure: RIGHT TOTAL KNEE ARTHROPLASTY;  Surgeon: Lamar Collet, MD;  Location: [facility];  Service: Orthopedics;  Laterality: Right;    Family History  Problem Relation Age of Onset   Lymphoma Father    Heart failure Mother    Diabetes type II Mother    Colon cancer Neg Hx    Esophageal cancer Neg Hx    Stomach cancer Neg Hx    Rectal cancer Neg Hx      Social Connections: Not on file     Current Outpatient Medications  Medication Instructions   Alogliptin Benzoate 25 mg, Daily   aspirin  81 mg, 2 times daily   empagliflozin (JARDIANCE) 12.5 mg, Daily   famotidine (PEPCID) 40 mg, Daily   gabapentin (NEURONTIN) 300 MG capsule TAKE ONE  CAPSULE BY MOUTH AT BEDTIME FOR NEUROPATHIC PAIN   glipiZIDE  (GLUCOTROL ) 5 mg, 2 times daily   hydrochlorothiazide  (HYDRODIURIL ) 25 MG tablet 0.5 tablets, Daily   losartan (COZAAR) 100 MG tablet TAKE ONE-HALF TABLET BY MOUTH DAILY (TAKE IN PLACE OF TELMISARTAN )   metFORMIN  (GLUCOPHAGE ) 1,000 mg, 2 times daily   pantoprazole (PROTONIX) 40 mg, 2 times daily   simvastatin  (ZOCOR ) 40 mg, Daily at bedtime     Physical Exam:   BP (!) 155/92 Comment: been coughing all night  Pulse 99   Ht 5' 9 (1.753 m)   Wt 177 lb (80.3 kg)   SpO2 (!) 84%   BMI 26.14 kg/m   Salient findings:  CN II-XII intact No lesions of oral cavity/oropharynx No obviously palpable neck masses/lymphadenopathy/thyromegaly Speaking in full sentences but breathing slightly labored at the end of a sentence; needs to take a breath  TFL was indicated to better evaluate the proximal airway, given the patient's history and exam findings, and is detailed below.   Seprately Identifiable Procedures:  Prior to initiating any procedures, risks/benefits/alternatives were explained to the patient and verbal consent obtained.  Procedure Note (10/03/2024) Pre-procedure diagnosis:  chronic cough, chronic throat clearing Post-procedure diagnosis: Same Procedure: Transnasal Fiberoptic Laryngoscopy, CPT 31575 - Mod 25 Indication: chronic cough, chronic throat clearing Complications: None apparent EBL: 0 mL  The procedure was undertaken to further evaluate the patient's complaint of chronic cough, chronic throat clearing with mirror exam inadequate for appropriate examination due to gag reflex and poor patient tolerance  Procedure:  Patient was identified as correct patient. Verbal consent was obtained. The nose was sprayed with oxymetazoline and 4% lidocaine . The The flexible laryngoscope was passed through the nose to view the nasal cavity, pharynx (oropharynx, hypopharynx) and larynx.  The larynx was examined at rest and during  multiple phonatory tasks. Documentation was obtained and reviewed with patient. The scope was removed. The patient tolerated the procedure well.  Findings: The nasal cavity and nasopharynx did not reveal any masses or lesions, mucosa appeared to be without obvious lesions. The tongue base, pharyngeal walls, piriform sinuses, vallecula, epiglottis and postcricoid region are normal in appearance EXCEPT: pooled secretions bilateral pyriform sinus. The visualized portion of the subglottis and proximal trachea is widely patent. The vocal folds are mobile bilaterally. There are no lesions on the free edge of the vocal folds nor elsewhere in the larynx worrisome for malignancy.    Electronically signed by: Hadassah JAYSON Parody, MD 10/03/2024 9:44 AM   Impression & Plans:  Reginald Chambers is a 78 y.o. male with   1. Chronic cough   2. Chronic throat clearing   3. Globus sensation   4. Low oxygen saturation    Assessment and Plan Assessment & Plan Chronic cough Low oxygen saturation Chronic cough exacerbated, likely due to lung infection. Dyspnea and low oxygen saturation suggest  lung etiology. No throat masses or tumors. Further lung evaluation needed. - He is planning to go to urgent care right after this which I agree with. Discussed it is not normal to have O2 sats in low 80s high 70s and he needs to be evaluated for this.  - pooled secretions on scope exam likely due to current productive cough   Chronic throat clearing - No evidence of lesion or masses on scope exam  - Scheduled follow-up in two months to reassess cough and throat clearing after acute illness has resolved    See below regarding exact medications prescribed this encounter including dosages and route: No orders of the defined types were placed in this encounter.   Thank you for allowing me the opportunity to care for your patient. Please do not hesitate to contact me should you have any other questions.  Sincerely, Hadassah Parody, MD Otolaryngologist (ENT), [facility] Phone: [phone] Fax: [phone]

## 2024-10-08 ENCOUNTER — Telehealth: Payer: Self-pay | Admitting: Cardiology

## 2024-10-08 NOTE — Telephone Encounter (Signed)
 Pt's daughter Izetta stated pt was transported by ambulance this morning from the [facility] to [place] and they found some blockage in his heart. She'd like to discuss further to be advised of what to do next. Please advise.

## 2024-10-08 NOTE — Telephone Encounter (Signed)
 Spoke to patient's daughter Izetta.She stated her father went to [facility] in [place] this morning to follow up recent pneumonia.Stated his O2 sat was 90%.They sent him to [facility] ED in [place].Stated he had a heart ct and they are waiting on results.She wanted Dr.Jordan to know.Advised he is out of office.I will make him aware.

## 2024-10-09 NOTE — Telephone Encounter (Signed)
 Spoke to patient's daughter Izetta Dr.Jordan's comments given.Stated father was still in ED.Follow up appointment scheduled with Dr.Jordan 12/11 at 10:40 am.

## 2024-10-21 NOTE — Progress Notes (Unsigned)
 New Patient Pulmonology Office Visit   Subjective:  Patient ID: Reginald Chambers, male    DOB: [birth year]  MRN: [id number]  Referred by: Jaime Morene SAILOR, MD  CC: No chief complaint on file.   HPI Reginald Chambers is a 78 y.o. male with  Interstitial Fibrosis  Asbestosis: Pleural Plaques: Right Hilar LAD: ASSESSMENT: Presents with lung findings concerning for asbestosis with fibrotic changes and pleural plaques.  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  Was also in [place] with agent orange exposure.  Discussed with pulmonology.  No further workup needed in the hospital.  Will continue prednisone with a longer taper at discharge.  Will also discharge the patient on 2 L nasal cannula to be weaned off in the outpatient setting. PLAN: Prednisone 40 mg x7 days then 12 day taper Referral sent for pulmonology at [facility] at discharge. Repeat CT scan in 3-6 months   Admitted on 10/08/24 due to AHRF for CAP  CT chest showed Interstitial fibrosis with small calcified pleural concerning for fibrosis and asbestosis.     {PULM QUESTIONNAIRES (Optional):33196} Discussed the use of AI scribe software for clinical note transcription with the patient, who gave verbal consent to proceed.   History of Present Illness   ROS  Allergies: Penicillins  Current Outpatient Medications:    Alogliptin Benzoate 25 MG TABS, Take 25 mg by mouth daily., Disp: , Rfl:    aspirin  81 MG tablet, Take 81 mg by mouth 2 (two) times daily., Disp: , Rfl:    empagliflozin (JARDIANCE) 25 MG TABS tablet, Take 12.5 mg by mouth daily. , Disp: , Rfl:    famotidine (PEPCID) 40 MG tablet, Take 40 mg by mouth daily., Disp: , Rfl:    gabapentin (NEURONTIN) 300 MG capsule, TAKE ONE CAPSULE BY MOUTH AT BEDTIME FOR NEUROPATHIC PAIN, Disp: , Rfl:    glipiZIDE  (GLUCOTROL ) 5 MG tablet, Take 5 mg by mouth 2 (two) times daily., Disp: , Rfl:    hydrochlorothiazide  (HYDRODIURIL ) 25 MG tablet, Take  0.5 tablets by mouth daily., Disp: , Rfl:    losartan (COZAAR) 100 MG tablet, TAKE ONE-HALF TABLET BY MOUTH DAILY (TAKE IN PLACE OF TELMISARTAN ), Disp: , Rfl:    metFORMIN  (GLUCOPHAGE ) 1000 MG tablet, Take 1,000 mg by mouth 2 (two) times daily., Disp: , Rfl: 0   pantoprazole (PROTONIX) 40 MG tablet, Take 40 mg by mouth 2 (two) times daily., Disp: , Rfl:    simvastatin  (ZOCOR ) 40 MG tablet, Take 40 mg by mouth at bedtime. , Disp: , Rfl:  Past Medical History:  Diagnosis Date   Cancer (HCC)    skin cancer- basal cell CA   Essential hypertension, benign    GERD (gastroesophageal reflux disease)    pt. denies   Hard of hearing    Heart murmur    at birth   History of agent Orange exposure    [place]   History of skin cancer    Malaria    Mixed hyperlipidemia    Nocturia    Osteoarthritis    Personal history of colonic polyps - adenoma 04/02/2009   S/P colonoscopy May 2010   7mm sessile polyp, adenoma in ascending colon, mild diverticulosis in sigmoid, internal hemorrhoids, needs repeat in 2015. Performed by Dr. Avram   Tinnitus    SINCE 1968   Type 2 diabetes mellitus [facility])    Past Surgical History:  Procedure Laterality Date   APPENDECTOMY  1959   BILATERAL KNEE ARTHROSCOPY  COLONOSCOPY  04/02/09   7 mm sessile polyp ascending colon/internal hemmorrhoids/mild diverticulosis sigmoid colon   EYE SURGERY Bilateral    cataracts   REVERSE SHOULDER ARTHROPLASTY Left 11/04/2016   REVERSE SHOULDER ARTHROPLASTY Left 11/04/2016   Procedure: LEFT REVERSE SHOULDER ARTHROPLASTY;  Surgeon: Franky Pointer, MD;  Location: [facility] OR;  Service: Orthopedics;  Laterality: Left;   right shoulder arthroscopy     ROTATOR CUFF REPAIR  1990   Left shoulder   TOTAL KNEE ARTHROPLASTY  2007   Left knee   TOTAL KNEE ARTHROPLASTY Right 12/24/2014   Procedure: RIGHT TOTAL KNEE ARTHROPLASTY;  Surgeon: Lamar Collet, MD;  Location: [facility] ORS;  Service: Orthopedics;  Laterality: Right;   Family History  Problem  Relation Age of Onset   Lymphoma Father    Heart failure Mother    Diabetes type II Mother    Colon cancer Neg Hx    Esophageal cancer Neg Hx    Stomach cancer Neg Hx    Rectal cancer Neg Hx    Social History   Socioeconomic History   Marital status: Widowed    Spouse name: Not on file   Number of children: 5   Years of education: Not on file   Highest education level: Not on file  Occupational History   Not on file  Tobacco Use   Smoking status: Former    Current packs/day: 0.00    Average packs/day: 1 pack/day for 5.0 years (5.0 ttl pk-yrs)    Types: Cigarettes    Start date: 02/18/1986    Quit date: 02/19/1991    Years since quitting: 33.6   Smokeless tobacco: Never  Substance and Sexual Activity   Alcohol use: Yes    Alcohol/week: 2.0 standard drinks of alcohol    Types: 2 Shots of liquor per week    Comment: daily   Drug use: No   Sexual activity: Not on file  Other Topics Concern   Not on file  Social History Narrative   Not on file   Social Drivers of Health   Financial Resource Strain: Not on file  Food Insecurity: No Food Insecurity (10/10/2024)   Received from [facility]   Hunger Vital Sign    Within the past 12 months, you worried that your food would run out before you got the money to buy more.: Never true    Within the past 12 months, the food you bought just didn't last and you didn't have money to get more.: Never true  Transportation Needs: No Transportation Needs (10/10/2024)   Received from [facility] - Transportation    In the past 12 months, has lack of transportation kept you from medical appointments or from getting medications?: No    In the past 12 months, has lack of transportation kept you from meetings, work, or from getting things needed for daily living?: No  Physical Activity: Not on file  Stress: No Stress Concern Present (10/10/2024)   Received from [facility] -  Occupational Stress Questionnaire    Do you feel stress - tense, restless, nervous, or anxious, or unable to sleep at night because your mind is troubled all the time - these days?: Only a little  Social Connections: Not on file  Intimate Partner Violence: Not At Risk (10/10/2024)   Received from [facility]   HITS    Over the last 12 months how often did your partner physically hurt you?: Never  Over the last 12 months how often did your partner insult you or talk down to you?: Never    Over the last 12 months how often did your partner threaten you with physical harm?: Never    Over the last 12 months how often did your partner scream or curse at you?: Never       Objective:  There were no vitals taken for this visit. {Pulm Vitals (Optional):32837}  Physical Exam  Diagnostic Review:  {Labs (Optional):32838}     Assessment & Plan:   Assessment & Plan  Assessment and Plan Assessment & Plan    No follow-ups on file.    Marny Patch, MD Pulmonary and Critical Care Medicine [facility]

## 2024-10-22 ENCOUNTER — Ambulatory Visit (INDEPENDENT_AMBULATORY_CARE_PROVIDER_SITE_OTHER)

## 2024-10-22 VITALS — BP 118/64 | HR 91 | Temp 98.0°F | Ht 65.0 in | Wt 178.4 lb

## 2024-10-22 DIAGNOSIS — J849 Interstitial pulmonary disease, unspecified: Secondary | ICD-10-CM | POA: Diagnosis not present

## 2024-10-22 DIAGNOSIS — Z77090 Contact with and (suspected) exposure to asbestos: Secondary | ICD-10-CM | POA: Diagnosis not present

## 2024-10-22 DIAGNOSIS — Z8701 Personal history of pneumonia (recurrent): Secondary | ICD-10-CM

## 2024-10-22 DIAGNOSIS — Z87891 Personal history of nicotine dependence: Secondary | ICD-10-CM | POA: Diagnosis not present

## 2024-10-22 LAB — SEDIMENTATION RATE: Sed Rate: 55 mm/h — ABNORMAL HIGH (ref 0–20)

## 2024-10-22 LAB — C-REACTIVE PROTEIN: CRP: 4.4 mg/dL (ref 0.5–20.0)

## 2024-10-22 NOTE — Patient Instructions (Addendum)
 Dear Mr. Radigan  Given your symptoms and interstitial lung disease on CT scan, I recommend the following: -ILD questionnaire -Basic autoimmune labs  -Pulmonary function test -I will request the images of your CT scan at [facility]. If it is possible try to obtain a CD, to be upload it in the system. -Repeat CT scan in 3 months for follow up   I will see you in 2 months to discuss further.

## 2024-10-23 ENCOUNTER — Ambulatory Visit: Payer: Self-pay

## 2024-10-23 DIAGNOSIS — J9611 Chronic respiratory failure with hypoxia: Secondary | ICD-10-CM

## 2024-10-24 LAB — CYCLIC CITRUL PEPTIDE ANTIBODY, IGG: Cyclic Citrullin Peptide Ab: <16 U

## 2024-10-24 LAB — ALDOLASE: Aldolase: 4 U/L (ref <=8.1)

## 2024-10-24 LAB — RHEUMATOID FACTOR: Rheumatoid fact SerPl-aCnc: <10 [IU]/mL (ref <14)

## 2024-10-24 LAB — SJOGRENS SYNDROME-B EXTRACTABLE NUCLEAR ANTIBODY: SSB (La) (ENA) Antibody, IgG: <1 AI

## 2024-10-24 LAB — ANTI-DNA ANTIBODY, DOUBLE-STRANDED: ds DNA Ab: <1 [IU]/mL

## 2024-10-24 LAB — ANTI-SCLERODERMA ANTIBODY: Scleroderma (Scl-70) (ENA) Antibody, IgG: <1 AI

## 2024-10-24 LAB — SJOGRENS SYNDROME-A EXTRACTABLE NUCLEAR ANTIBODY: SSA (Ro) (ENA) Antibody, IgG: <1 AI

## 2024-10-24 LAB — ANA: Anti Nuclear Antibody (ANA): NEGATIVE

## 2024-10-24 NOTE — Telephone Encounter (Signed)
 I see at the [facility] 11/24 the pt was walked for oxygen and it shows he did qualify for 2L POC but I do not see where that order was placed.  Dr. Adrien can you please advise if order for portable oxygen was supposed to be placed.

## 2024-10-26 LAB — HYPERSENSITIVITY PNEUMONITIS
A. Pullulans Abs: NEGATIVE
A.Fumigatus #1 Abs: NEGATIVE
Micropolyspora faeni, IgG: NEGATIVE
Pigeon Serum Abs: NEGATIVE
Thermoact. Saccharii: NEGATIVE
Thermoactinomyces vulgaris, IgG: NEGATIVE

## 2024-10-26 LAB — RNP ANTIBODIES: ENA RNP Ab: 0.5 AI (ref 0.0–0.9)

## 2024-10-29 ENCOUNTER — Telehealth: Payer: Self-pay

## 2024-10-29 NOTE — Telephone Encounter (Signed)
 Copied from CRM #[id number]. Topic: General - Other >> Oct 24, 2024  5:05 PM Rilla B wrote: Reason for CRM: Patient returning call to office regarding portable oxygen. Patient [facility] the provider [facility]  Please call patient @ [phone].    Tried to reach out to patient  VM/ LM -  (An order was placed on 11/26 it takes a few business days to process and with the holiday it may take a bit longer someone will reach out in due time)

## 2024-10-29 NOTE — Progress Notes (Signed)
 Cardiology Office Note   Date:  11/08/2024   ID:  Reginald Chambers, DOB 06-27-1946, MRN [id number]  PCP:  Clinic, Reginald Chambers  Cardiologist:   Reginald Ostroff, MD   Chief Complaint  Patient presents with   Coronary Artery Disease   Shortness of Breath      History of Present Illness: Reginald Chambers is a 78 y.o. male who presents for follow up.[name] He has a history of DM type 2, hypercholesterolemia, and HTN. He had prior cardiac evaluation in 2005 and 2012 with normal stress Myoview  studies. Repeat Echo and Myoview  studies in 2019 were normal.  He was seen in May with some chest tightness while digging a ditch. Coronary CTA done showing nonobstructive CAD with high coronary calcium score.  He was recently admitted to [facility] in November for CAP. Had fibrotic changes on CT. No PE. Normal troponin and NT pro BNP. Treated with antibiotics and steroids. Had urinary retention. Has followed up with pulmonary with plans for PFTs and repeat CT in Jan. Suspect significant asbestos exposure in past.  Patient reports he is still quite SOB with activity. Has nonproductive cough. No chest pain or edema.   Past Medical History:  Diagnosis Date   Cancer (HCC)    skin cancer- basal cell CA   Essential hypertension, benign    GERD (gastroesophageal reflux disease)    pt. denies   Hard of hearing    Heart murmur    at birth   History of agent Orange exposure    [place]   History of skin cancer    [place]    Mixed hyperlipidemia    Nocturia    Osteoarthritis    Personal history of colonic polyps - adenoma 04/02/2009   S/P colonoscopy May 2010   7mm sessile polyp, adenoma in ascending colon, mild diverticulosis in sigmoid, internal hemorrhoids, needs repeat in 2015. Performed by Dr. Avram   Tinnitus    SINCE 1968   Type 2 diabetes mellitus [facility])     Past Surgical History:  Procedure Laterality Date   APPENDECTOMY  1959   BILATERAL KNEE ARTHROSCOPY     COLONOSCOPY   04/02/09   7 mm sessile polyp ascending colon/internal hemmorrhoids/mild diverticulosis sigmoid colon   EYE SURGERY Bilateral    cataracts   REVERSE SHOULDER ARTHROPLASTY Left 11/04/2016   REVERSE SHOULDER ARTHROPLASTY Left 11/04/2016   Procedure: LEFT REVERSE SHOULDER ARTHROPLASTY;  Surgeon: Reginald Pointer, MD;  Location: [facility] OR;  Service: Orthopedics;  Laterality: Left;   right shoulder arthroscopy     ROTATOR CUFF REPAIR  1990   Left shoulder   TOTAL KNEE ARTHROPLASTY  2007   Left knee   TOTAL KNEE ARTHROPLASTY Right 12/24/2014   Procedure: RIGHT TOTAL KNEE ARTHROPLASTY;  Surgeon: Lamar Collet, MD;  Location: [facility];  Service: Orthopedics;  Laterality: Right;     Current Outpatient Medications  Medication Sig Dispense Refill   aspirin  81 MG tablet Take 81 mg by mouth 2 (two) times daily.     empagliflozin (JARDIANCE) 25 MG TABS tablet Take 12.5 mg by mouth daily.      famotidine (PEPCID) 40 MG tablet Take 40 mg by mouth daily.     gabapentin (NEURONTIN) 300 MG capsule TAKE ONE CAPSULE BY MOUTH AT BEDTIME FOR NEUROPATHIC PAIN     glipiZIDE  (GLUCOTROL ) 5 MG tablet Take 5 mg by mouth 2 (two) times daily.     hydrochlorothiazide  (HYDRODIURIL ) 25 MG tablet Take 0.5 tablets by mouth daily.  losartan (COZAAR) 100 MG tablet TAKE ONE-HALF TABLET BY MOUTH DAILY (TAKE IN PLACE OF TELMISARTAN )     metFORMIN  (GLUCOPHAGE ) 1000 MG tablet Take 1,000 mg by mouth 2 (two) times daily.  0   pantoprazole (PROTONIX) 40 MG tablet Take 40 mg by mouth 2 (two) times daily.     predniSONE (DELTASONE) 10 MG tablet Take 10 mg by mouth.     simvastatin  (ZOCOR ) 40 MG tablet Take 40 mg by mouth at bedtime.      tamsulosin  (FLOMAX ) 0.4 MG CAPS capsule Take 0.4 mg by mouth.     Alogliptin Benzoate 25 MG TABS Take 25 mg by mouth daily. (Patient not taking: Reported on 11/08/2024)     No current facility-administered medications for this visit.    Allergies:   Penicillins    Social History:  The patient  reports  that he quit smoking about 33 years ago. His smoking use included cigarettes. He started smoking about 38 years ago. He has a 5 pack-year smoking history. He has never used smokeless tobacco. He reports current alcohol use of about 2.0 standard drinks of alcohol per week. He reports that he does not use drugs.   Family History:  The patient's family history includes Diabetes type II in his mother; Heart failure in his mother; Lymphoma in his father.    ROS:  Please see the history of present illness.   Otherwise, review of systems are positive for none.   All other systems are reviewed and negative.    PHYSICAL EXAM: VS:  BP 124/76   Pulse 89   Ht 5' 9 (1.753 m)   Wt 181 lb 12.8 oz (82.5 kg)   SpO2 91%   BMI 26.85 kg/m  , BMI Body mass index is 26.85 kg/m. GEN: Well nourished, obese, in no acute distress  HEENT: normal  Neck: no JVD, carotid bruits, or masses Cardiac: RRR; no murmurs, rubs, or gallops,no edema. Pedal pulses 2+ Respiratory:  bilateral crackles.  GI: soft, nontender, nondistended, + BS MS: no deformity or atrophy. Old surgical scars on knees.  Skin: warm and dry, no rash, no edema.  Neuro:  Strength and sensation are intact Psych: euthymic mood, full affect   EKG Interpretation Date/Time:  Thursday November 08 2024 10:26:24 EST Ventricular Rate:  89 PR Interval:  174 QRS Duration:  96 QT Interval:  350 QTC Calculation: 425 R Axis:   -2  Text Interpretation: Normal sinus rhythm Minimal voltage criteria for LVH, may be normal variant ( R in aVL ) Nonspecific T wave abnormality When compared with ECG of 11-Dec-2014 10:59, Premature ventricular complexes are no longer Present Vent. rate has increased BY  33 BPM T wave inversion now evident in Lateral leads Confirmed by Reginald Chambers [phone]) on 11/08/2024 10:32:40 AM    Coronary CTA 04/21/23:Cardiac/Coronary  CT   TECHNIQUE: The patient was scanned on a [employer].   FINDINGS: A 120 kV prospective  scan was triggered in the descending thoracic aorta at 111 HU's. Axial non-contrast 3 mm slices were carried out through the heart. The data set was analyzed on a dedicated work station and scored using the Agatson method. Gantry rotation speed was 250 msecs and collimation was .6 mm. No beta blockade and 0.8 mg of sl NTG was given. The 3D data set was reconstructed in 5% intervals of the 67-82 % of the R-R cycle. Diastolic phases were analyzed on a dedicated work station using MPR, MIP and VRT modes. The patient  received 80 cc of contrast.   Aorta: Normal size. Ascending aorta 3.8 cm. Aortic atherosclerosis. No dissection.   Aortic Valve: Trileaflet.  Mild calcification.   Coronary Arteries:  Normal coronary origin.  Right dominance.   RCA is a large dominant artery that gives rise to PDA and PLVB. There is diffuse minimally obstructive (<25%) disease.   Left main is a large artery that gives rise to LAD and LCX arteries.   LAD is a large vessel that has minimal (<25%) calcified plaque proximally, mild (25-49%) calcified plaque between D1 and D2, and minimal plaque distally. D1 has moderate (50-69%) plaque in the mid vessel. D2 has moderate (50-69%) calcified plaque at the ostium.   LCX is a non-dominant artery that has mild (25-49%) plaque. It gives rise to two OM branches.   Coronary Calcium Score:   Left main: 0   Left anterior descending artery: 600   Left circumflex artery: 365   Right coronary artery: 287   Total: 1252   Percentile: 82nd   Other findings:   Normal pulmonary vein drainage into the left atrium.   Normal let atrial appendage without a thrombus.   Normal size of the pulmonary artery.   IMPRESSION: 1. Coronary calcium score of 1252. This was 82nd percentile for age-, sex, and race-matched controls.   2. Total plaque volume 1492 mm3 which is 87th percentile for age- and sex-matched controls (calcified plaque 249 mm3; non-calcified plaque  1243 mm3). TPV is (extensive).   2. Normal coronary origin with right dominance.   3. Normal coronary arteries.  FFRCT ANALYSIS for abnormal coronary CTA   FINDINGS: FFRct analysis was performed on the original cardiac CT angiogram dataset. Diagrammatic representation of the FFRct analysis is provided in a separate PDF document in PACS. This dictation was created using the PDF document and an interactive 3D model of the results. 3D model is not available in the EMR/PACS. Normal FFR range is >0.80.   1. Left Main: FFR CT 1.0   2. LAD: FFR CT 0.98 proximal, 0.88 mid, 0.79 distal. D1 FFR CT 0.97 proximal, 0.76 mid. D2 FFR CT 0.91   3. LCX: FFR CT 0.98 proximal, 0.92 mid   4. RCA: FFR CT 0.99 proximal, 0.97 mid, 0.97 distal   IMPRESSION: 1. FFR CT findings are inconclusive in D1. D2 consistent with nonobstructive CAD   2. Consider functional testing or invasive angiography if clinically indicated.   Tiffany C. Raford, MD    Recent Labs: No results found for requested labs within last 365 days.    Lipid Panel No results found for: CHOL, TRIG, HDL, CHOLHDL, VLDL, LDLCALC, LDLDIRECT   Dated 12/10/19: cholesterol 137, triglycerides 77, HDL 50, LDL 72. Creatinine 1.07. otherwise CMET and CBC normal.  Dated 07/16/20: A1c 7.4% Dated 01/01/21: cholesterol 125, triglycerides 89, HDL 44, LDL 64. A1c 7.7%.CMET and CBC normal Dated 08/19/22: cholesterol 128, triglycerides 81, HDL 50, LDL 62. A1c 6.4%, CBC, CMET and TSH normal   Wt Readings from Last 3 Encounters:  11/08/24 181 lb 12.8 oz (82.5 kg)  10/22/24 178 lb 6.4 oz (80.9 kg)  10/03/24 177 lb (80.3 kg)      Other studies Reviewed: Additional studies/ records that were reviewed today include: old stress Myoview  studies from 2005 and 2012.[name] Review of the above records demonstrates: Normal perfusion. EF 56%.   Myoview  04/04/18: Study Highlights    The left ventricular ejection fraction is mildly decreased  (45-54%). Nuclear stress EF: 51%. Blood pressure demonstrated a normal  response to exercise. There was no ST segment deviation noted during stress. The study is normal. This is a low risk study.   Normal resting and stress perfusion. No ischemia or infarction EF 51%   Echo 04/03/18: Study Conclusions   - Left ventricle: The cavity size was normal. Systolic function was    normal. The estimated ejection fraction was in the range of 60%    to 65%. Wall motion was normal; there were no regional wall    motion abnormalities. Left ventricular diastolic function    parameters were normal.  - Left atrium: The atrium was mildly dilated.  - Atrial septum: No defect or patent foramen ovale was identified.    ASSESSMENT AND PLAN:  1. HTN controlled.  2. DM type 2  per primary care.  3. Hyperlipidemia. On statin therapy. Request a copy of most recent labs.   4. CAD with high coronary calcium score but nonobstructive disease. Continue risk factor modification  5. Recent PNA  6. Pulmonary fibrosis. Evaluation per pulmonary    Current medicines are reviewed at length with the patient today.  The patient does not have concerns regarding medicines.  The following changes have been made:  no change  Labs/ tests ordered today include:   Orders Placed This Encounter  Procedures   EKG 12-Lead     Disposition:   FU one year  Signed, Jurline Folger, MD  11/08/2024 10:42 AM    [facility] [address] [place], [place], [address] Phone [phone], Fax [phone]

## 2024-10-30 ENCOUNTER — Other Ambulatory Visit (HOSPITAL_COMMUNITY): Payer: Self-pay | Admitting: Urology

## 2024-10-30 DIAGNOSIS — N281 Cyst of kidney, acquired: Secondary | ICD-10-CM

## 2024-10-30 LAB — ANTI-JO-1 AB (RDL): Anti-Jo-1 Ab (RDL): <20 U (ref <20)

## 2024-11-08 ENCOUNTER — Ambulatory Visit: Attending: Cardiology | Admitting: Cardiology

## 2024-11-08 ENCOUNTER — Encounter: Payer: Self-pay | Admitting: Cardiology

## 2024-11-08 VITALS — BP 124/76 | HR 89 | Ht 69.0 in | Wt 181.8 lb

## 2024-11-08 DIAGNOSIS — I2510 Atherosclerotic heart disease of native coronary artery without angina pectoris: Secondary | ICD-10-CM | POA: Diagnosis not present

## 2024-11-08 DIAGNOSIS — E782 Mixed hyperlipidemia: Secondary | ICD-10-CM | POA: Diagnosis not present

## 2024-11-08 DIAGNOSIS — I10 Essential (primary) hypertension: Secondary | ICD-10-CM

## 2024-11-08 NOTE — Patient Instructions (Signed)
 Medication Instructions:  Continue same medications *If you need a refill on your cardiac medications before your next appointment, please call your pharmacy*  Lab Work: None ordered  Testing/Procedures: None ordered  Follow-Up: At [facility], you and your health needs are our priority.  As part of our continuing mission to provide you with exceptional heart care, our providers are all part of one team.  This team includes your primary Cardiologist (physician) and Advanced Practice Providers or APPs (Physician Assistants and Nurse Practitioners) who all work together to provide you with the care you need, when you need it.  Your next appointment:  1 year     Call in August to schedule Dec appointment     Provider:  Dr.Jordan   We recommend signing up for the patient portal called MyChart.  Sign up information is provided on this After Visit Summary.  MyChart is used to connect with patients for Virtual Visits (Telemedicine).  Patients are able to view lab/test results, encounter notes, upcoming appointments, etc.  Non-urgent messages can be sent to your provider as well.   To learn more about what you can do with MyChart, go to [email or web address].

## 2024-11-13 ENCOUNTER — Telehealth: Payer: Self-pay

## 2024-11-13 NOTE — Telephone Encounter (Signed)
 Copied from CRM #[id number]. Topic: Clinical - Order For Equipment >> Nov 13, 2024  2:55 PM Devaughn S wrote: Reason for CRM: Pt friend Randall called in regards to Portable Oxygen Concentrator for the pt and she stated the pt has still not received it and she is unsure where it is supposed to be coming from. Please f/u regarding this.    Tried to reach out to patient VM / LM okay per DPR looks like order was placed on 11/26 to [facility] please reach out to them [phone]

## 2024-11-20 ENCOUNTER — Other Ambulatory Visit: Payer: Self-pay | Admitting: *Deleted

## 2024-11-20 ENCOUNTER — Telehealth: Payer: Self-pay

## 2024-11-20 DIAGNOSIS — J9611 Chronic respiratory failure with hypoxia: Secondary | ICD-10-CM

## 2024-11-20 DIAGNOSIS — J849 Interstitial pulmonary disease, unspecified: Secondary | ICD-10-CM

## 2024-11-20 NOTE — Telephone Encounter (Signed)
 Copied from CRM [id number]. Topic: Clinical - Order For Equipment >> Nov 20, 2024  9:42 AM Corean SAUNDERS wrote: Reason for CRM: Patients representative Randall [facility]) is requesting the patients POC order to be faxed to [facility] at [phone]  Randall is requesting this to be completed right away as she needs to take patient out of town and needs to the POC in order to do this and states this has been an ongoing matter for a month.   Order was placed again by Powell, RN today. Routing encounter to her as she is working on it.

## 2024-11-28 NOTE — Telephone Encounter (Signed)
 Message sent to [facility] to make sure this was resolved.  New order was placed on 12/23.

## 2024-12-06 ENCOUNTER — Encounter (INDEPENDENT_AMBULATORY_CARE_PROVIDER_SITE_OTHER): Payer: Self-pay

## 2024-12-06 ENCOUNTER — Ambulatory Visit (INDEPENDENT_AMBULATORY_CARE_PROVIDER_SITE_OTHER)

## 2024-12-06 VITALS — BP 134/53 | HR 72

## 2024-12-06 DIAGNOSIS — R053 Chronic cough: Secondary | ICD-10-CM

## 2024-12-06 DIAGNOSIS — R0989 Other specified symptoms and signs involving the circulatory and respiratory systems: Secondary | ICD-10-CM | POA: Diagnosis not present

## 2024-12-06 DIAGNOSIS — H9313 Tinnitus, bilateral: Secondary | ICD-10-CM

## 2024-12-06 NOTE — Progress Notes (Signed)
 Dear Dr. Bertell, Here is my assessment for our mutual patient, Reginald Chambers. Thank you for allowing me the opportunity to care for your patient. Please do not hesitate to contact me should you have any other questions. Sincerely, Dr. Hadassah Chambers  Otolaryngology Clinic Note Referring provider: Dr. Bertell HPI:   Initial HPI (10/03/24) Reginald Chambers is a 79 year old male with chronic cough who presents with chronic throat clearing over past few years as well as worsening cough over the past month.   Chronic throat clearing present for years, attributed previously by ENT to acid reflux. Took reflux medications and has not been helpful. Over the last month started having cough, sometimes productive. He went to PCP who ordered CXR that showed bilateral lung base opacities. He was given a z-pack but cough has not abated and feels like he is starting to feel worse. They are measuring his O2 sats at home and they get into the 70s-80s at night time, usually running in 90s otherwise.  Reporting significant dyspnea. Symptoms have progressively worsened over the past week.   - No nasal obstruction, nasal drainage  - No dysphagia - No significant nasal allergies, though occasional mild allergies occur  Environmental and occupational exposures - History of tobacco use from 1968 to 1972; no tobacco use since - Exposure to [employer] in 1968 during [employer] - disabled veteran  --------------------------------------------------------- 12/06/2024  Presents for follow-up today.  After his visit last time, he saw his PCP, was still satting in the high 70s low 80s, was admitted to the hospital and diagnosed with bacterial pneumonia and interstitial fibrosis, possible asbestosis.  His pneumonia has now been treated.  He is on home oxygen.  Pulmonary function testing and repeat CT chest are pending currently.  He still has a persistent cough.  Cough is now intermittently productive with small  amounts of sputum predominantly in the mornings. He is also having frequent throat clearing and intermittent hoarseness. No significant nasal congestion or rhinorrhea.  No postnasal drip. Treated for GERD in the past without significant improvement in his cough or throat clearing  Also reporting chronic bilateral tinnitus described as constant ringing since the 1960s.  Tinnitus is persistent but does not interfere with sleep.  He had prior otologic testing but this was years ago.     Independent Review of Additional Tests or Records:  CXR 09/17/24: early infectious vs inflammatory process in bilateral lung base left > right  Discharge summary Reginald Garrison, DO: Treated with cefdinir and doxycycline for bilateral bacterial pneumonia.  Lung findings on imaging concerning for asbestosis with fibrotic changes and pleural plaques.  Planning for repeat CT scan in 3 to 6 months and referral to [facility]  10/22/2024 hypersensitive pneumonitis panel: Negative  10/22/2024 ANA negative    PMH/Meds/All/SocHx/FamHx/ROS:   Past Medical History:  Diagnosis Date   Cancer (HCC)    skin cancer- basal cell CA   Essential hypertension, benign    GERD (gastroesophageal reflux disease)    pt. denies   Hard of hearing    Heart murmur    at birth   History of agent Orange exposure    [place]   History of skin cancer    Malaria    Mixed hyperlipidemia    Nocturia    Osteoarthritis    Personal history of colonic polyps - adenoma 04/02/2009   S/P colonoscopy May 2010   7mm sessile polyp, adenoma in ascending colon, mild diverticulosis in sigmoid, internal hemorrhoids, needs  repeat in 2015. Performed by Dr. Avram   Tinnitus    SINCE 1968   Type 2 diabetes mellitus [facility])      Past Surgical History:  Procedure Laterality Date   APPENDECTOMY  1959   BILATERAL KNEE ARTHROSCOPY     COLONOSCOPY  04/02/09   7 mm sessile polyp ascending colon/internal hemmorrhoids/mild diverticulosis sigmoid colon    EYE SURGERY Bilateral    cataracts   REVERSE SHOULDER ARTHROPLASTY Left 11/04/2016   REVERSE SHOULDER ARTHROPLASTY Left 11/04/2016   Procedure: LEFT REVERSE SHOULDER ARTHROPLASTY;  Surgeon: Franky Pointer, MD;  Location: [facility];  Service: Orthopedics;  Laterality: Left;   right shoulder arthroscopy     ROTATOR CUFF REPAIR  1990   Left shoulder   TOTAL KNEE ARTHROPLASTY  2007   Left knee   TOTAL KNEE ARTHROPLASTY Right 12/24/2014   Procedure: RIGHT TOTAL KNEE ARTHROPLASTY;  Surgeon: Lamar Collet, MD;  Location: [facility];  Service: Orthopedics;  Laterality: Right;    Family History  Problem Relation Age of Onset   Lymphoma Father    Heart failure Mother    Diabetes type II Mother    Colon cancer Neg Hx    Esophageal cancer Neg Hx    Stomach cancer Neg Hx    Rectal cancer Neg Hx      Social Connections: Not on file     Current Outpatient Medications  Medication Instructions   Alogliptin Benzoate 25 mg, Daily   aspirin  81 mg, 2 times daily   empagliflozin (JARDIANCE) 12.5 mg, Daily   famotidine (PEPCID) 40 mg, Daily   gabapentin (NEURONTIN) 300 MG capsule TAKE ONE CAPSULE BY MOUTH AT BEDTIME FOR NEUROPATHIC PAIN   glipiZIDE  (GLUCOTROL ) 5 mg, 2 times daily   hydrochlorothiazide  (HYDRODIURIL ) 25 MG tablet 0.5 tablets, Daily   losartan (COZAAR) 100 MG tablet TAKE ONE-HALF TABLET BY MOUTH DAILY (TAKE IN PLACE OF TELMISARTAN )   metFORMIN  (GLUCOPHAGE ) 1,000 mg, 2 times daily   pantoprazole (PROTONIX) 40 mg, 2 times daily   predniSONE (DELTASONE) 10 mg   simvastatin  (ZOCOR ) 40 mg, Daily at bedtime   tamsulosin  (FLOMAX ) 0.4 mg     Physical Exam:   BP (!) 134/53 (BP Location: Left Arm, Patient Position: Sitting)   Pulse 72   SpO2 95%   Salient findings:  CN II-XII intact No lesions of oral cavity/oropharynx No obviously palpable neck masses/lymphadenopathy/thyromegaly Speaking in full sentences -dyspnea much improved since his last visit TFL was indicated to better evaluate  the proximal airway, given the patient's history and exam findings, and is detailed below.   Seprately Identifiable Procedures:  Prior to initiating any procedures, risks/benefits/alternatives were explained to the patient and verbal consent obtained.  Procedure Note (12/06/2024) Pre-procedure diagnosis:  chronic cough, chronic throat clearing Post-procedure diagnosis: Same Procedure: Transnasal Fiberoptic Laryngoscopy, CPT 31575 - Mod 25 Indication: chronic cough, chronic throat clearing Complications: None apparent EBL: 0 mL  The procedure was undertaken to further evaluate the patient's complaint of chronic cough, chronic throat clearing with mirror exam inadequate for appropriate examination due to gag reflex and poor patient tolerance  Procedure:  Patient was identified as correct patient. Verbal consent was obtained. The nose was sprayed with oxymetazoline and 4% lidocaine . The The flexible laryngoscope was passed through the nose to view the nasal cavity, pharynx (oropharynx, hypopharynx) and larynx.  The larynx was examined at rest and during multiple phonatory tasks. Documentation was obtained and reviewed with patient. The scope was removed. The patient tolerated the procedure well.  Findings: The nasal cavity and nasopharynx did not reveal any masses or lesions, mucosa appeared to be without obvious lesions. The tongue base, pharyngeal walls, piriform sinuses, vallecula, epiglottis and postcricoid region are normal in appearance EXCEPT: Pooled secretions are no longer present.  The visualized portion of the subglottis and proximal trachea is widely patent. The vocal folds are mobile bilaterally. There are no lesions on the free edge of the vocal folds nor elsewhere in the larynx worrisome for malignancy.    Electronically signed by: Reginald JAYSON Parody, MD 12/06/2024 6:43 PM   Impression & Plans:  Reginald Chambers is a 79 y.o. male with   1. Chronic cough   2. Tinnitus of both ears    3. Chronic throat clearing    Assessment and Plan Assessment & Plan Chronic cough Chronic throat clearing  Chronic cough and throat clearing are likely multifactorial, with contributions from post-infectious laryngeal irritation, possible habitual throat clearing, and laryngeal hypersensitivity, some contribution from lung disease. No evidence of malignant or concerning laryngeal pathology was identified on examination. Reflux therapy was ineffective, and there is no current evidence of significant postnasal drainage. Symptoms have improved since recent pneumonia, but cough and throat clearing remain frequent and bothersome. Further pulmonary evaluation is pending to assess for ongoing pulmonary contribution. - Recommended supportive care including increased water intake and behavioral modification strategies to reduce throat clearing and cough, such as substituting water drinking for throat clearing. - Advised follow-up after chest CT and pulmonary function testing to reassess if cough persists. - Discussed referral to voice therapy for cough/throat clearing suppression if symptoms remain bothersome after pulmonary evaluation. - Provided reassurance regarding absence of dangerous laryngeal findings and likely post-infectious etiology.  Bilateral tinnitus Bilateral tinnitus is longstanding and likely secondary to sensorineural hearing loss but will need audiogram to confirm. - Ordered baseline audiogram to assess current hearing status and guide management. - Planned follow-up in approximately four months to review audiogram results and reassess tinnitus and cough symptoms.   See below regarding exact medications prescribed this encounter including dosages and route: No orders of the defined types were placed in this encounter.   Thank you for allowing me the opportunity to care for your patient. Please do not hesitate to contact me should you have any other questions.  Sincerely, Reginald Parody, MD Otolaryngologist (ENT), [facility] Phone: [phone] Fax: [phone]  MDM:  Level 4 Complexity/Problems addressed: 4-2 chronic problems Data complexity: 4 independent review of discharge summary, 2 labs - Morbidity:   - Prescription Drug prescribed or managed: no

## 2024-12-07 NOTE — Telephone Encounter (Signed)
 PCCs,  Can you please send the order for the POC to the [facility] for this patient?  It does not need to go to [employer] or [employer].  Thank you.

## 2024-12-10 ENCOUNTER — Telehealth: Payer: Self-pay

## 2024-12-10 ENCOUNTER — Ambulatory Visit: Admitting: *Deleted

## 2024-12-10 DIAGNOSIS — J849 Interstitial pulmonary disease, unspecified: Secondary | ICD-10-CM | POA: Diagnosis not present

## 2024-12-10 LAB — PULMONARY FUNCTION TEST
DL/VA % pred: 99 %
DL/VA: 3.92 ml/min/mmHg/L
DLCO cor % pred: 57 %
DLCO cor: 14.07 ml/min/mmHg
DLCO unc % pred: 57 %
DLCO unc: 14.07 ml/min/mmHg
FEF 25-75 Post: 1.42 L/s
FEF 25-75 Pre: 3.8 L/s
FEF2575-%Change-Post: -62 %
FEF2575-%Pred-Post: 67 %
FEF2575-%Pred-Pre: 181 %
FEV1-%Change-Post: -16 %
FEV1-%Pred-Post: 62 %
FEV1-%Pred-Pre: 75 %
FEV1-Post: 1.85 L
FEV1-Pre: 2.23 L
FEV1FVC-%Change-Post: -11 %
FEV1FVC-%Pred-Pre: 122 %
FEV6-%Change-Post: -5 %
FEV6-%Pred-Post: 61 %
FEV6-%Pred-Pre: 65 %
FEV6-Post: 2.37 L
FEV6-Pre: 2.52 L
FEV6FVC-%Pred-Post: 106 %
FEV6FVC-%Pred-Pre: 106 %
FVC-%Change-Post: -5 %
FVC-%Pred-Post: 57 %
FVC-%Pred-Pre: 61 %
FVC-Post: 2.37 L
FVC-Pre: 2.52 L
Post FEV1/FVC ratio: 78 %
Post FEV6/FVC ratio: 100 %
Pre FEV1/FVC ratio: 89 %
Pre FEV6/FVC Ratio: 100 %
RV % pred: 54 %
RV: 1.4 L
TLC % pred: 56 %
TLC: 3.94 L

## 2024-12-10 NOTE — Patient Instructions (Signed)
 Full PFT performed today.

## 2024-12-10 NOTE — Progress Notes (Signed)
 Full PFT performed today.

## 2024-12-10 NOTE — Progress Notes (Addendum)
 "  New Patient Pulmonology Office Visit   Subjective:  Patient ID: Reginald Chambers, male    DOB: 07-Mar-1946  MRN: [id number]  Referred by: Adrien Guan, Tamala, MD  CC:  Chief Complaint  Patient presents with   Interstitial Lung Disease    Pt states since LOV breathing has been pretty good SOB occurs w exertion Dry cough    Discussed the use of AI scribe software for clinical note transcription with the patient, who gave verbal consent to proceed. History of Present Illness Reginald Chambers is a 80 year old male without any pulmonary condition who presents for evaluation of interstitial lung disease. Patient reported he is very active, playing golf at baseline. Prior admission denied cough, and dyspnea. He noticed to start having cough for 2 months, and he was treated with PPI for GERD. He was sent to ENT and had a normal laryngoscopy and he had hypoxia to 84% at the visit and he was admitted to the hospital for one week on 10/08/2024. During the admission he had a CT scan showing pulmonary fibrosis and pleural plaques, concerning for asbestos, as well GGO concerning for pneumonia. He received antibiotics (cefdinir+doxycycline) and prednisone taper and discharged with oxygen 2L prn with exertion only.  Overall he feels better, his cough is persistent. He had an extensive AI work up negative.  No significant AI symptoms except for dry eyes, mouth and some joint pain.   Interval history: -Patient has been feeling at baseline, no changes on his dyspnea. He is trying to be more active and playing golf. -He continues with cough, sometimes productive daily.  -His oxygen at home without O2 is around 89-90% around the house.  -Unfortunately he has not received his POC. His company is [employer] and they dont have POC. It has been hard to transition to another O2 company [employer]. I requested to cancel oxygen from [employer] and transition to [employer] to obtain the POC. I also provide a order of POC to take it  to the [facility]. -His PFT showed moderate restriction and mild dlco reduction.  Exposure, Social and Family Hx. He is not aware of any family history of interstitial lung disease or lung fibrosis. He smoked when he was 79yo, but he has not smoked 50 years ago. He worked at [employer] as agent orange exposure and went to [place] years in 1967[place]. He was exposed to herbicides. 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 Retired at age 56yo. Denied history of AI disease.  No exposure to birds. No mold, no water leakage at home. House is from [address].  He has a cat.   ROS as above   Allergies: Penicillins  Current Outpatient Medications:    Alogliptin Benzoate 25 MG TABS, Take 25 mg by mouth daily., Disp: , Rfl:    aspirin  81 MG tablet, Take 81 mg by mouth 2 (two) times daily., Disp: , Rfl:    empagliflozin (JARDIANCE) 25 MG TABS tablet, Take 12.5 mg by mouth daily. , Disp: , Rfl:    famotidine (PEPCID) 40 MG tablet, Take 40 mg by mouth daily., Disp: , Rfl:    gabapentin (NEURONTIN) 300 MG capsule, TAKE ONE CAPSULE BY MOUTH AT BEDTIME FOR NEUROPATHIC PAIN, Disp: , Rfl:    glipiZIDE  (GLUCOTROL ) 5 MG tablet, Take 5 mg by mouth 2 (two) times daily., Disp: , Rfl:    hydrochlorothiazide  (HYDRODIURIL ) 25 MG tablet, Take 0.5 tablets by mouth  daily., Disp: , Rfl:    losartan (COZAAR) 100 MG tablet, TAKE ONE-HALF TABLET BY MOUTH DAILY (TAKE IN PLACE OF TELMISARTAN ), Disp: , Rfl:    metFORMIN  (GLUCOPHAGE ) 1000 MG tablet, Take 1,000 mg by mouth 2 (two) times daily., Disp: , Rfl: 0   pantoprazole (PROTONIX) 40 MG tablet, Take 40 mg by mouth 2 (two) times daily., Disp: , Rfl:    predniSONE (DELTASONE) 10 MG tablet, Take 10 mg by mouth., Disp: , Rfl:    simvastatin  (ZOCOR ) 40 MG tablet, Take 40 mg by mouth at bedtime. , Disp: , Rfl:    tamsulosin  (FLOMAX ) 0.4 MG CAPS capsule, Take 0.4 mg by mouth., Disp: , Rfl:  Past Medical History:  Diagnosis  Date   Cancer (HCC)    skin cancer- basal cell CA   Essential hypertension, benign    GERD (gastroesophageal reflux disease)    pt. denies   Hard of hearing    Heart murmur    at birth   History of agent Orange exposure    [place]   History of skin cancer    Malaria    Mixed hyperlipidemia    Nocturia    Osteoarthritis    Personal history of colonic polyps - adenoma 04/02/2009   S/P colonoscopy May 2010   7mm sessile polyp, adenoma in ascending colon, mild diverticulosis in sigmoid, internal hemorrhoids, needs repeat in 2015. Performed by Dr. Avram   Tinnitus    SINCE 1968   Type 2 diabetes mellitus [facility])    Past Surgical History:  Procedure Laterality Date   APPENDECTOMY  1959   BILATERAL KNEE ARTHROSCOPY     COLONOSCOPY  04/02/09   7 mm sessile polyp ascending colon/internal hemmorrhoids/mild diverticulosis sigmoid colon   EYE SURGERY Bilateral    cataracts   REVERSE SHOULDER ARTHROPLASTY Left 11/04/2016   REVERSE SHOULDER ARTHROPLASTY Left 11/04/2016   Procedure: LEFT REVERSE SHOULDER ARTHROPLASTY;  Surgeon: Franky Pointer, MD;  Location: [facility] OR;  Service: Orthopedics;  Laterality: Left;   right shoulder arthroscopy     ROTATOR CUFF REPAIR  1990   Left shoulder   TOTAL KNEE ARTHROPLASTY  2007   Left knee   TOTAL KNEE ARTHROPLASTY Right 12/24/2014   Procedure: RIGHT TOTAL KNEE ARTHROPLASTY;  Surgeon: Lamar Collet, MD;  Location: [facility];  Service: Orthopedics;  Laterality: Right;   Family History  Problem Relation Age of Onset   Lymphoma Father    Heart failure Mother    Diabetes type II Mother    Colon cancer Neg Hx    Esophageal cancer Neg Hx    Stomach cancer Neg Hx    Rectal cancer Neg Hx    Social History   Socioeconomic History   Marital status: Widowed    Spouse name: Not on file   Number of children: 5   Years of education: Not on file   Highest education level: Not on file  Occupational History   Not on file  Tobacco Use   Smoking status: Former     Current packs/day: 0.00    Average packs/day: 1 pack/day for 5.0 years (5.0 ttl pk-yrs)    Types: Cigarettes    Start date: 02/18/1986    Quit date: 02/19/1991    Years since quitting: 33.8   Smokeless tobacco: Never  Substance and Sexual Activity   Alcohol use: Yes    Alcohol/week: 2.0 standard drinks of alcohol    Types: 2 Shots of liquor per week    Comment: daily  Drug use: No   Sexual activity: Not on file  Other Topics Concern   Not on file  Social History Narrative   Not on file   Social Drivers of Health   Tobacco Use: Medium Risk (12/06/2024)   Patient History    Smoking Tobacco Use: Former    Smokeless Tobacco Use: Never    Passive Exposure: Not on [employer] Strain: Not on file  Food Insecurity: No Food Insecurity (10/10/2024)   Received from [facility]   Epic    Within the past 12 months, you worried that your food would run out before you got the money to buy more.: Never true    Within the past 12 months, the food you bought just didn't last and you didn't have money to get more.: Never true  Transportation Needs: No Transportation Needs (10/10/2024)   Received from [facility]    In the past 12 months, has lack of transportation kept you from medical appointments or from getting medications?: No    In the past 12 months, has lack of transportation kept you from meetings, work, or from getting things needed for daily living?: No  Physical Activity: Not on file  Stress: No Stress Concern Present (10/10/2024)   Received from [facility] - Occupational Stress Questionnaire    Do you feel stress - tense, restless, nervous, or anxious, or unable to sleep at night because your mind is troubled all the time - these days?: Only a little  Social Connections: Not on file  Intimate Partner Violence: Not At Risk (10/10/2024)   Received from [facility]   HITS    Over the last 12 months how often did  your partner physically hurt you?: Never    Over the last 12 months how often did your partner insult you or talk down to you?: Never    Over the last 12 months how often did your partner threaten you with physical harm?: Never    Over the last 12 months how often did your partner scream or curse at you?: Never  Depression (PHQ2-9): Not on file  Alcohol Screen: Not on file  Housing: Low Risk (10/10/2024)   [facility]    In the last 12 months, was there a time when you were not able to pay the mortgage or rent on time?: No    In the past 12 months, how many times have you moved where you were living?: 0    At any time in the past 12 months, were you homeless or living in a shelter (including now)?: No  Utilities: Not At Risk (10/10/2024)   Received from [facility]    In the past 12 months has the electric, gas, oil, or water company threatened to shut off services in your home?: No  Health Literacy: Not on file       Objective:  There were no vitals taken for this visit. Wt Readings from Last 3 Encounters:  11/08/24 181 lb 12.8 oz (82.5 kg)  10/22/24 178 lb 6.4 oz (80.9 kg)  10/03/24 177 lb (80.3 kg)   BMI Readings from Last 3 Encounters:  11/08/24 26.85 kg/m  10/22/24 29.69 kg/m  10/03/24 26.14 kg/m   SpO2 Readings from Last 3 Encounters:  12/06/24 95%  11/08/24 91%  10/22/24 94%    Physical Exam Constitutional:      Appearance:  Normal appearance.  HENT:     Head: Normocephalic.  Eyes:     Extraocular Movements: Extraocular movements intact.     Pupils: Pupils are equal, round, and reactive to light.  Cardiovascular:     Rate and Rhythm: Normal rate and regular rhythm.  Pulmonary:     Effort: Pulmonary effort is normal.     Comments: Crackles bilaterally up to 1/4 on the bases. Musculoskeletal:        General: Normal range of motion.  Neurological:     General: No focal deficit present.     Mental Status: He is alert and  oriented to person, place, and time.      CT chest 10/08/24 - unable to see the images > requesting power share. No pulmonary embolus is identified.  Probable background interstitial fibrosis/asbestosis with small calcified pleural plaques. Bilateral groundglass opacities suspicious for superimposed pneumonitis, pneumonia, and/or edema.  Enlarged right hilar node may be reactive considering the lung changes. Suggest 3-6 month follow-up chest CT.   Walking test at [facility] 10/22/24  O2 HR Rest     94       103 Lap 1   93        113 Lap 2   92        116 Lap 3   93        118  AI work up 10/22/2025 SSA, SSB neg CRP 4.4  ESR 55 CCP <16, RF<10 HP neg RNP neg Anti Jo<20 Scl-70 neg Ds DNA<1 neg ANA neg Aldolase neg  PFTs 12/10/24 FVC   2.5/-2.8/61% FEV1 2.2/-1.5/75% F/F 89 TLC    3.9/-38/56% RV      1.4/-3.1/54% RV/TLC 36 DLCO   14/-2.7/57%  12/10/2024  - No obstruction, no BD response. Moderate restriction, and mild reduction of DLCO.   Assessment & Plan:   Assessment & Plan Interstitial pulmonary disease Asbestos exposure for 50 years Chronic hypoxic respiratory failure.  Patient with recent admission on 09/2024 due to pneumonia and he was found to have ILD with pleural plaques concerning for asbestos as well GGO due to possible pneumonia, unable to see the images form [facility]. 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 He has retired at age 45yo.  After admission, patient was discharged with 2L of oxygen PRN. Her AI work up has been negative, he denied AI symptoms except for dry eyes/mouth. His PFT showed moderate restriction and mild DLCO reduction, no obstruction. He has mild dyspnea with exertion and able to perform his ADL. I have discussed with him that I may need to start con antifibrotic therapy, but I am waiting to get the CT scans to compare.  Plan - Requested CT scan images from [facility] again today. - Provided questionnaire for detailed  assessment of potential causes of interstitial lung disease. -Start nebulizer for chest congestion  - albuterol . -CT chest in February months for evaluation of resolution of GGO. -I requested change of O2 company from [employer] to [employer] to obtain a POC. I also provide the oxygen prescription to the patient to see if he is able to obtain by the [facility].  -RTC in march to discuss possible about ofev and safety labs.   Return in about 3 months (around 03/11/2025).   Marny Patch, MD Pulmonary and Critical Care Medicine [facility] "

## 2024-12-11 ENCOUNTER — Ambulatory Visit

## 2024-12-11 ENCOUNTER — Ambulatory Visit: Admission: RE | Admit: 2024-12-11 | Discharge: 2024-12-11 | Disposition: A | Payer: Self-pay | Source: Ambulatory Visit

## 2024-12-11 ENCOUNTER — Other Ambulatory Visit: Payer: Self-pay

## 2024-12-11 VITALS — BP 128/70 | HR 63 | Temp 98.2°F | Ht 69.0 in | Wt 189.0 lb

## 2024-12-11 DIAGNOSIS — J849 Interstitial pulmonary disease, unspecified: Secondary | ICD-10-CM

## 2024-12-11 DIAGNOSIS — Z87891 Personal history of nicotine dependence: Secondary | ICD-10-CM

## 2024-12-11 DIAGNOSIS — J9611 Chronic respiratory failure with hypoxia: Secondary | ICD-10-CM

## 2024-12-11 DIAGNOSIS — Z77090 Contact with and (suspected) exposure to asbestos: Secondary | ICD-10-CM

## 2024-12-11 MED ORDER — ALBUTEROL SULFATE (2.5 MG/3ML) 0.083% IN NEBU: 2.5000 mg | INHALATION_SOLUTION | Freq: Three times a day (TID) | RESPIRATORY_TRACT | 6 refills | Status: AC

## 2024-12-11 NOTE — Patient Instructions (Addendum)
 Dear Reginald Chambers;   -I will recommend the following: For chest congestion: albuterol  nebulizer 2-3 times a day as need for chest congestion. -I will wait for the next CT scan to see if there is resolution of the prior pneumonia, to evaluate your interstitial lung disease.  -All your autoimmune work up came back negative. -I will give the portable oxygen prescription to go with you PCP at the [facility] to see if possible to get it for you.  -I highly recommend to continue exercise, it is very important for you.  I will see you in March to discuss possible therapies as anti scarring medication as Ofev.

## 2024-12-12 ENCOUNTER — Telehealth: Payer: Self-pay

## 2024-12-12 NOTE — Telephone Encounter (Signed)
 Per [employer] - we are processing order for POC but we are needing  the qualifing testing sats for o2 referral to complete order for processing with insurance  Please advise

## 2024-12-12 NOTE — Telephone Encounter (Signed)
 Copied from CRM [id number]. Topic: Clinical - Order For Equipment >> Dec 12, 2024 11:42 AM Rilla NOVAK wrote: Reason for CRM:  Randall, friend, calling regarding order for equipment.  States it's been going on for over a month.  She spoke with [employer]. Please fax order for Portable Oxygen concentrator to [facility] FAX: [phone]. She states if she gets the fax today she can have the equipment to him tomorrow.    Please call patient to confirm it's sent [phone] Jessica, Sig other). >> Dec 12, 2024  2:06 PM Isabell A wrote: Sueanne states the correct fax number is [phone]   [place] and spoke to [place]. Advised that it looks like an order was sent originally to [facility], then a new order was sent to d/c [employer] oxygen and a new order was placed through [employer] as [employer] is on back order, per pt.  Sueanne expressed her concern that [employer] would pick up the oxygen before [facility] would supply the new oxygen.   I called [facility] to ensure that they had received the order and was told that they are working on it and that they will coordinate with pt a good time to deliver the oxygen, around the same time that [employer] picks up the previous oxygen to avoid a lapse in oxygen.   Called Jennie back and informed of the above. She verbalized understanding, NFN.

## 2024-12-14 NOTE — Telephone Encounter (Signed)
 Hi, I ordered on 10/22/24 Power share images of CT angio chest (10/08/24) from [facility] and it has been 6 weeks and I have not receive the images. How I can expedite to get the images? Thank you!    Hi, Dr Adrien, I checked power share and the images were uploaded on 12/11/24

## 2024-12-17 ENCOUNTER — Telehealth (HOSPITAL_BASED_OUTPATIENT_CLINIC_OR_DEPARTMENT_OTHER): Payer: Self-pay

## 2024-12-17 DIAGNOSIS — J849 Interstitial pulmonary disease, unspecified: Secondary | ICD-10-CM

## 2024-12-17 NOTE — Telephone Encounter (Signed)
 I found that note showing 92 in nov but to qualify it has to be 88 or below to qualify for insurance - this was per Dolanda with [employer]

## 2024-12-17 NOTE — Telephone Encounter (Signed)
 Per Dolanda with [employer]: I found that note showing 92 in nov but to qualify it has to be 88 or below to qualify for insurance .    Please advise

## 2024-12-18 NOTE — Addendum Note (Signed)
 Addended by: SANCHEZ FERNANDEZ, Nillie Bartolotta L on: 12/18/2024 09:38 PM   Modules accepted: Orders

## 2024-12-20 NOTE — Telephone Encounter (Signed)
 I have spoken to [employer] with [employer] and requested imaging from [facility] to be uploaded.  Imaging is available under CT outside films.

## 2024-12-26 NOTE — Telephone Encounter (Signed)
 Contacted DME about updated information; [facility] is looking into this

## 2024-12-26 NOTE — Telephone Encounter (Signed)
 Could you please schedule 6 minute walk? Thank you

## 2024-12-28 NOTE — Telephone Encounter (Signed)
 Spoke to pt and scheduled walk test for 01/01/2025 AT 11:00A. NFN.

## 2025-01-01 ENCOUNTER — Telehealth: Payer: Self-pay

## 2025-01-01 ENCOUNTER — Ambulatory Visit

## 2025-01-01 DIAGNOSIS — J9611 Chronic respiratory failure with hypoxia: Secondary | ICD-10-CM | POA: Diagnosis not present

## 2025-01-01 DIAGNOSIS — J849 Interstitial pulmonary disease, unspecified: Secondary | ICD-10-CM | POA: Diagnosis not present

## 2025-01-01 NOTE — Progress Notes (Signed)
"   Six Minute Walk - 01/01/25 1315       Six Minute Walk   Supplemental oxygen during test? Yes    O2 Flow Rate (L/min) 2 L/min    Type Pulse    Lap distance in meters  34 meters    Laps Completed 32    Partial lap (in meters) 0 meters    Baseline BP (sitting) 137/77    Baseline Heartrate 72    Baseline Dyspnea (Borg Scale) 0    Baseline Fatigue (Borg Scale) 0    Baseline SPO2 97 %   RA     End of Test Values    BP (sitting) 139/67    Heartrate 71    Dyspnea (Borg Scale) 2    Fatigue (Borg Scale) 3    SPO2 96 %   2L continuous/2L POC     2 Minutes Post Walk Values   BP (sitting) 135/76    Heartrate 68    SPO2 99 %   2L     Interpretation   Distance completed 1088 meters    Tech Comments: Started out walk at a fast pace.  By the middle of the walk his pace had slowed down and he was becoming sob.  Sat dropped to 86-88% on RA.  Placed on 2L continuous and 2L POC.  Sats up to 96%.          "

## 2025-01-02 NOTE — Telephone Encounter (Signed)
 [facility] has all updated information

## 2025-01-02 NOTE — Telephone Encounter (Signed)
 See referral placed yesterday. [employer] has contacted the pt for POC. NFN

## 2025-01-22 ENCOUNTER — Other Ambulatory Visit (HOSPITAL_COMMUNITY)

## 2025-01-28 ENCOUNTER — Other Ambulatory Visit (HOSPITAL_COMMUNITY)

## 2025-03-20 ENCOUNTER — Ambulatory Visit

## 2025-04-08 ENCOUNTER — Ambulatory Visit (INDEPENDENT_AMBULATORY_CARE_PROVIDER_SITE_OTHER): Admitting: Audiology

## 2025-04-08 ENCOUNTER — Ambulatory Visit (INDEPENDENT_AMBULATORY_CARE_PROVIDER_SITE_OTHER)
